# Patient Record
Sex: Male | Born: 1970 | Race: White | Hispanic: No | Marital: Married | Smoking: Never smoker
Health system: Southern US, Community
[De-identification: ages and names within clinical notes are randomized; demographics above are authoritative.]

## PROBLEM LIST (undated history)

## (undated) DIAGNOSIS — I10 Essential (primary) hypertension: Secondary | ICD-10-CM

## (undated) HISTORY — PX: COLONOSCOPY: SHX174

## (undated) HISTORY — PX: WISDOM TOOTH EXTRACTION: SHX21

---

## 2012-12-29 HISTORY — PX: COLONOSCOPY: SHX174

## 2015-01-24 ENCOUNTER — Ambulatory Visit (INDEPENDENT_AMBULATORY_CARE_PROVIDER_SITE_OTHER): Payer: BLUE CROSS/BLUE SHIELD | Admitting: Podiatry

## 2015-01-24 ENCOUNTER — Encounter: Payer: Self-pay | Admitting: Podiatry

## 2015-01-24 ENCOUNTER — Ambulatory Visit (INDEPENDENT_AMBULATORY_CARE_PROVIDER_SITE_OTHER): Payer: BLUE CROSS/BLUE SHIELD

## 2015-01-24 VITALS — BP 117/81 | HR 89 | Resp 14

## 2015-01-24 DIAGNOSIS — M216X9 Other acquired deformities of unspecified foot: Secondary | ICD-10-CM | POA: Diagnosis not present

## 2015-01-24 DIAGNOSIS — R52 Pain, unspecified: Secondary | ICD-10-CM | POA: Diagnosis not present

## 2015-01-24 DIAGNOSIS — M722 Plantar fascial fibromatosis: Secondary | ICD-10-CM | POA: Insufficient documentation

## 2015-01-24 HISTORY — DX: Plantar fascial fibromatosis: M72.2

## 2015-01-24 HISTORY — DX: Other acquired deformities of unspecified foot: M21.6X9

## 2015-01-24 MED ORDER — MELOXICAM 15 MG PO TABS: 15.0000 mg | ORAL_TABLET | Freq: Every day | ORAL | Status: DC

## 2015-01-24 NOTE — Progress Notes (Signed)
   Subjective:    Patient ID: Troy Jacobs, male    DOB: 05-07-1970, 45 y.o.   MRN: [id number]  HPI this patient presents to the office with chief complaint of painful heels on both feet. He states he has pain experiencing after days activity as well as awakening in the morning.  He says he has been dealing with this pain for over 15 years. He says he has tried many over-the-counter arch supports which help initially but then the heel pain returns. He admits he has severe high arch feet. He presents the office today for an evaluation and treatment and recommendation for his feet  The patient is here today for B/L feet pain (especially the heels).  Review of Systems  All other systems reviewed and are negative.      Objective:   Physical Exam GENERAL APPEARANCE: Alert, conversant. Appropriately groomed. No acute distress.  VASCULAR: Pedal pulses palpable at  [facility] and PT bilateral.  Capillary refill time is immediate to all digits,  Normal temperature gradient.  Digital hair growth is present bilateral  NEUROLOGIC: sensation is normal to 5.07 monofilament at 5/5 sites bilateral.  Light touch is intact bilateral, Muscle strength normal.  MUSCULOSKELETAL: acceptable muscle strength, tone and stability bilateral.  Intrinsic muscluature intact bilateral.  Rectus appearance of foot and digits noted bilateral. Palpable pain at the insertion plantar fascia B/L.  DERMATOLOGIC: skin color, texture, and turgor are within normal limits.  No preulcerative lesions or ulcers  are seen, no interdigital maceration noted.  No open lesions present.  Digital nails are asymptomatic. No drainage noted.         Assessment & Plan:  Plantar fascitis  Cavus feet B/L  IE  Xrays taken.  Discussed his condition with patient.  Told to schedule orthotic casting. Prescribed Mobic to be taken prn.   Gardiner Barefoot DPM   Gardiner Barefoot DPM

## 2015-02-21 ENCOUNTER — Other Ambulatory Visit: Payer: Self-pay | Admitting: Podiatry

## 2017-05-06 DIAGNOSIS — K429 Umbilical hernia without obstruction or gangrene: Secondary | ICD-10-CM | POA: Insufficient documentation

## 2017-05-06 HISTORY — DX: Umbilical hernia without obstruction or gangrene: K42.9

## 2017-06-20 ENCOUNTER — Other Ambulatory Visit: Payer: Self-pay | Admitting: Urology

## 2017-07-01 DIAGNOSIS — C801 Malignant (primary) neoplasm, unspecified: Secondary | ICD-10-CM | POA: Insufficient documentation

## 2017-07-01 HISTORY — DX: Malignant (primary) neoplasm, unspecified: C80.1

## 2017-07-17 ENCOUNTER — Encounter (HOSPITAL_COMMUNITY): Payer: Self-pay

## 2017-07-17 NOTE — Patient Instructions (Signed)
Troy Jacobs  07/17/2017   Your procedure is scheduled on: 07-24-17  Report to [facility] Main  Entrance             Report to admitting at      [place] AM    Call this number if you have problems the morning of surgery [phone]    Remember:   Follow a clear liquid diet the day before your surgery Do not eat food or drink liquids :After Midnight.   Drink one bottle of magnesium citrate by noon the day before your surgery    CLEAR LIQUID DIET   Foods Allowed                                                                     Foods Excluded  Coffee and tea, regular and decaf                             liquids that you cannot  Plain Jell-O in any flavor                                             see through such as: Fruit ices (not with fruit pulp)                                     milk, soups, orange juice  Iced Popsicles                                    All solid food Carbonated beverages, regular and diet                                    Cranberry, grape and apple juices Sports drinks like Gatorade Lightly seasoned clear broth or consume(fat free) Sugar, honey syrup  Sample Menu Breakfast                                Lunch                                     Supper Cranberry juice                    Beef broth                            Chicken broth Jell-O                                     Grape juice  Apple juice Coffee or tea                        Jell-O                                      Popsicle                                                Coffee or tea                        Coffee or tea  _____________________________________________________________________     Take these medicines the morning of surgery with A SIP OF WATER: NONE                                You may not have any metal on your body including hair pins and              piercings  Do not wear jewelry,lotions, powders or perfumes,  deodorant                  Men may shave face and neck.   Do not bring valuables to the hospital. [place].  Contacts, dentures or bridgework may not be worn into surgery.  Leave suitcase in the car. After surgery it may be brought to your room.               Please read over the following fact sheets you were given: _____________________________________________________________________           [facility] - Preparing for Surgery Before surgery, you can play an important role.  Because skin is not sterile, your skin needs to be as free of germs as possible.  You can reduce the number of germs on your skin by washing with CHG (chlorahexidine gluconate) soap before surgery.  CHG is an antiseptic cleaner which kills germs and bonds with the skin to continue killing germs even after washing. Please DO NOT use if you have an allergy to CHG or antibacterial soaps.  If your skin becomes reddened/irritated stop using the CHG and inform your nurse when you arrive at Short Stay. Do not shave (including legs and underarms) for at least 48 hours prior to the first CHG shower.  You may shave your face/neck. Please follow these instructions carefully:  1.  Shower with CHG Soap the night before surgery and the  morning of Surgery.  2.  If you choose to wash your hair, wash your hair first as usual with your  normal  shampoo.  3.  After you shampoo, rinse your hair and body thoroughly to remove the  shampoo.                           4.  Use CHG as you would any other liquid soap.  You can apply chg directly  to the skin and wash  Gently with a scrungie or clean washcloth.  5.  Apply the CHG Soap to your body ONLY FROM THE NECK DOWN.   Do not use on face/ open                           Wound or open sores. Avoid contact with eyes, ears mouth and genitals (private parts).                       Wash face,  Genitals (private parts) with  your normal soap.             6.  Wash thoroughly, paying special attention to the area where your surgery  will be performed.  7.  Thoroughly rinse your body with warm water from the neck down.  8.  DO NOT shower/wash with your normal soap after using and rinsing off  the CHG Soap.                9.  Pat yourself dry with a clean towel.            10.  Wear clean pajamas.            11.  Place clean sheets on your bed the night of your first shower and do not  sleep with pets. Day of Surgery : Do not apply any lotions/deodorants the morning of surgery.  Please wear clean clothes to the hospital/surgery center.  FAILURE TO FOLLOW THESE INSTRUCTIONS MAY RESULT IN THE CANCELLATION OF YOUR SURGERY PATIENT SIGNATURE_________________________________  NURSE SIGNATURE__________________________________  ________________________________________________________________________

## 2017-07-18 ENCOUNTER — Encounter (HOSPITAL_COMMUNITY): Payer: Self-pay

## 2017-07-18 ENCOUNTER — Other Ambulatory Visit: Payer: Self-pay

## 2017-07-18 ENCOUNTER — Encounter (HOSPITAL_COMMUNITY)
Admission: RE | Admit: 2017-07-18 | Discharge: 2017-07-18 | Disposition: A | Discharge status: Home / Self Care | Payer: BLUE CROSS/BLUE SHIELD | Source: Ambulatory Visit | Attending: Urology | Admitting: Urology

## 2017-07-18 DIAGNOSIS — N2889 Other specified disorders of kidney and ureter: Secondary | ICD-10-CM | POA: Insufficient documentation

## 2017-07-18 DIAGNOSIS — Z01818 Encounter for other preprocedural examination: Secondary | ICD-10-CM | POA: Insufficient documentation

## 2017-07-18 HISTORY — DX: Essential (primary) hypertension: I10

## 2017-07-18 NOTE — Progress Notes (Signed)
EKG 07-04-17  On chart  CBC and CMP from 07-04-17 on chart

## 2017-07-23 NOTE — Anesthesia Preprocedure Evaluation (Addendum)
Anesthesia Evaluation  Patient identified by MRN, date of birth, ID band Patient awake    Reviewed: Allergy & Precautions, NPO status , Patient's Chart, lab work & pertinent test results  Airway Mallampati: II  TM Distance: >3 FB Neck ROM: Full    Dental no notable dental hx.    Pulmonary neg pulmonary ROS,    Pulmonary exam normal breath sounds clear to auscultation       Cardiovascular hypertension, Pt. on medications Normal cardiovascular exam Rhythm:Regular Rate:Normal  ECG: NSR, rate 82   Neuro/Psych negative neurological ROS  negative psych ROS   GI/Hepatic negative GI ROS, Neg liver ROS,   Endo/Other  negative endocrine ROS  Renal/GU negative Renal ROS     Musculoskeletal negative musculoskeletal ROS (+)   Abdominal   Peds  Hematology negative hematology ROS (+)   Anesthesia Other Findings LEFT RENAL MASS  Reproductive/Obstetrics                            Anesthesia Physical Anesthesia Plan  ASA: II  Anesthesia Plan: General   Post-op Pain Management:    Induction: Intravenous  PONV Risk Score and Plan: 4 or greater and Ondansetron, Dexamethasone, Midazolam, Treatment may vary due to age or medical condition and Scopolamine patch - Pre-op  Airway Management Planned: Oral ETT  Additional Equipment:   Intra-op Plan:   Post-operative Plan: Extubation in OR  Informed Consent: I have reviewed the patients History and Physical, chart, labs and discussed the procedure including the risks, benefits and alternatives for the proposed anesthesia with the patient or authorized representative who has indicated his/her understanding and acceptance.   Dental advisory given  Plan Discussed with: CRNA  Anesthesia Plan Comments:         Anesthesia Quick Evaluation

## 2017-07-24 ENCOUNTER — Inpatient Hospital Stay (HOSPITAL_COMMUNITY): Payer: BLUE CROSS/BLUE SHIELD | Admitting: Anesthesiology

## 2017-07-24 ENCOUNTER — Encounter (HOSPITAL_COMMUNITY)
Admission: RE | Disposition: A | Discharge status: Home / Self Care | Payer: Self-pay | Source: Ambulatory Visit | Attending: Urology

## 2017-07-24 ENCOUNTER — Other Ambulatory Visit: Payer: Self-pay

## 2017-07-24 ENCOUNTER — Encounter (HOSPITAL_COMMUNITY): Payer: Self-pay

## 2017-07-24 ENCOUNTER — Inpatient Hospital Stay (HOSPITAL_COMMUNITY)
Admission: RE | Admit: 2017-07-24 | Discharge: 2017-07-25 | DRG: 658 | Disposition: A | Discharge status: Home / Self Care | Payer: BLUE CROSS/BLUE SHIELD | Source: Ambulatory Visit | Attending: Urology | Admitting: Urology

## 2017-07-24 DIAGNOSIS — N2889 Other specified disorders of kidney and ureter: Secondary | ICD-10-CM | POA: Diagnosis present

## 2017-07-24 DIAGNOSIS — E785 Hyperlipidemia, unspecified: Secondary | ICD-10-CM | POA: Diagnosis present

## 2017-07-24 DIAGNOSIS — I10 Essential (primary) hypertension: Secondary | ICD-10-CM | POA: Diagnosis present

## 2017-07-24 DIAGNOSIS — Z79899 Other long term (current) drug therapy: Secondary | ICD-10-CM

## 2017-07-24 DIAGNOSIS — C642 Malignant neoplasm of left kidney, except renal pelvis: Principal | ICD-10-CM | POA: Diagnosis present

## 2017-07-24 HISTORY — DX: Other specified disorders of kidney and ureter: N28.89

## 2017-07-24 HISTORY — PX: ROBOTIC ASSITED PARTIAL NEPHRECTOMY: SHX6087

## 2017-07-24 LAB — HEMOGLOBIN AND HEMATOCRIT, BLOOD
HEMATOCRIT: 44.5 % (ref 39.0–52.0)
Hemoglobin: 15.2 g/dL (ref 13.0–17.0)

## 2017-07-24 LAB — TYPE AND SCREEN
ABO/RH(D): A POS
ANTIBODY SCREEN: NEGATIVE

## 2017-07-24 LAB — ABO/RH: ABO/RH(D): A POS

## 2017-07-24 SURGERY — NEPHRECTOMY, PARTIAL, ROBOT-ASSISTED
Anesthesia: General | Laterality: Left

## 2017-07-24 MED ORDER — DEXTROSE-NACL 5-0.45 % IV SOLN
INTRAVENOUS | Status: DC
  Administered 2017-07-24 (×2): via INTRAVENOUS

## 2017-07-24 MED ORDER — ROCURONIUM BROMIDE 10 MG/ML (PF) SYRINGE
PREFILLED_SYRINGE | INTRAVENOUS | Status: AC
  Filled 2017-07-24: qty 10

## 2017-07-24 MED ORDER — OXYCODONE HCL 5 MG PO TABS: 5.0000 mg | ORAL_TABLET | ORAL | Status: DC | PRN

## 2017-07-24 MED ORDER — LACTATED RINGERS IV SOLN
INTRAVENOUS | Status: DC
  Administered 2017-07-24 (×3): via INTRAVENOUS

## 2017-07-24 MED ORDER — LIDOCAINE 2% (20 MG/ML) 5 ML SYRINGE
INTRAMUSCULAR | Status: AC
  Filled 2017-07-24: qty 5

## 2017-07-24 MED ORDER — PROPOFOL 10 MG/ML IV BOLUS
INTRAVENOUS | Status: AC
Start: 2017-07-24 — End: ?
  Filled 2017-07-24: qty 20

## 2017-07-24 MED ORDER — LIDOCAINE 2% (20 MG/ML) 5 ML SYRINGE
INTRAMUSCULAR | Status: DC | PRN
  Administered 2017-07-24: 100 mg via INTRAVENOUS

## 2017-07-24 MED ORDER — POLYVINYL ALCOHOL 1.4 % OP SOLN: 1.0000 [drp] | OPHTHALMIC | Status: DC | PRN

## 2017-07-24 MED ORDER — BENAZEPRIL HCL 20 MG PO TABS
40.0000 mg | ORAL_TABLET | Freq: Every day | ORAL | Status: DC
  Administered 2017-07-24: 40 mg via ORAL
  Filled 2017-07-24: qty 2

## 2017-07-24 MED ORDER — DEXAMETHASONE SODIUM PHOSPHATE 10 MG/ML IJ SOLN
INTRAMUSCULAR | Status: DC | PRN
  Administered 2017-07-24: 10 mg via INTRAVENOUS

## 2017-07-24 MED ORDER — SCOPOLAMINE 1 MG/3DAYS TD PT72
MEDICATED_PATCH | TRANSDERMAL | Status: AC
  Filled 2017-07-24: qty 1

## 2017-07-24 MED ORDER — OXYCODONE HCL 5 MG/5ML PO SOLN
5.0000 mg | Freq: Once | ORAL | Status: DC | PRN
  Filled 2017-07-24: qty 5

## 2017-07-24 MED ORDER — FENTANYL CITRATE (PF) 100 MCG/2ML IJ SOLN
INTRAMUSCULAR | Status: DC | PRN
  Administered 2017-07-24 (×3): 50 ug via INTRAVENOUS

## 2017-07-24 MED ORDER — HYDROMORPHONE HCL 1 MG/ML IJ SOLN
0.5000 mg | INTRAMUSCULAR | Status: DC | PRN
  Administered 2017-07-24: 1 mg via INTRAVENOUS
  Administered 2017-07-24 (×2): 0.5 mg via INTRAVENOUS
  Administered 2017-07-25: 1 mg via INTRAVENOUS
  Filled 2017-07-24 (×4): qty 1

## 2017-07-24 MED ORDER — PROMETHAZINE HCL 25 MG/ML IJ SOLN
6.2500 mg | INTRAMUSCULAR | Status: DC | PRN
  Administered 2017-07-24: 12.5 mg via INTRAVENOUS

## 2017-07-24 MED ORDER — FENTANYL CITRATE (PF) 250 MCG/5ML IJ SOLN
INTRAMUSCULAR | Status: AC
  Filled 2017-07-24: qty 5

## 2017-07-24 MED ORDER — AMLODIPINE BESY-BENAZEPRIL HCL 5-40 MG PO CAPS: 1.0000 | ORAL_CAPSULE | Freq: Every evening | ORAL | Status: DC

## 2017-07-24 MED ORDER — PROPOFOL 10 MG/ML IV BOLUS
INTRAVENOUS | Status: DC | PRN
  Administered 2017-07-24: 180 mg via INTRAVENOUS

## 2017-07-24 MED ORDER — ACETAMINOPHEN 160 MG/5ML PO SOLN: 960.0000 mg | Freq: Once | ORAL | Status: AC

## 2017-07-24 MED ORDER — MIDAZOLAM HCL 5 MG/5ML IJ SOLN
INTRAMUSCULAR | Status: DC | PRN
  Administered 2017-07-24: 2 mg via INTRAVENOUS

## 2017-07-24 MED ORDER — HYDROCODONE-ACETAMINOPHEN 5-325 MG PO TABS: 1.0000 | ORAL_TABLET | Freq: Four times a day (QID) | ORAL | 0 refills | Status: DC | PRN

## 2017-07-24 MED ORDER — MIDAZOLAM HCL 2 MG/2ML IJ SOLN
INTRAMUSCULAR | Status: AC
Start: 2017-07-24 — End: ?
  Filled 2017-07-24: qty 2

## 2017-07-24 MED ORDER — AMLODIPINE BESYLATE 5 MG PO TABS
5.0000 mg | ORAL_TABLET | Freq: Every day | ORAL | Status: DC
  Administered 2017-07-24: 5 mg via ORAL
  Filled 2017-07-24: qty 1

## 2017-07-24 MED ORDER — ACETAMINOPHEN 500 MG PO TABS
1000.0000 mg | ORAL_TABLET | Freq: Once | ORAL | Status: AC
  Administered 2017-07-24: 1000 mg via ORAL
  Filled 2017-07-24: qty 2

## 2017-07-24 MED ORDER — SCOPOLAMINE 1 MG/3DAYS TD PT72
1.0000 | MEDICATED_PATCH | Freq: Once | TRANSDERMAL | Status: DC
  Administered 2017-07-24: 1.5 mg via TRANSDERMAL

## 2017-07-24 MED ORDER — POLYETHYL GLYCOL-PROPYL GLYCOL 0.4-0.3 % OP SOLN: 1.0000 [drp] | Freq: Three times a day (TID) | OPHTHALMIC | Status: DC | PRN

## 2017-07-24 MED ORDER — CEFAZOLIN SODIUM-DEXTROSE 2-4 GM/100ML-% IV SOLN
2.0000 g | INTRAVENOUS | Status: AC
  Administered 2017-07-24: 2 g via INTRAVENOUS
  Filled 2017-07-24: qty 100

## 2017-07-24 MED ORDER — HYDROMORPHONE HCL 1 MG/ML IJ SOLN
0.2500 mg | INTRAMUSCULAR | Status: DC | PRN
  Administered 2017-07-24 (×4): 0.5 mg via INTRAVENOUS

## 2017-07-24 MED ORDER — DIPHENHYDRAMINE HCL 50 MG/ML IJ SOLN: 12.5000 mg | Freq: Four times a day (QID) | INTRAMUSCULAR | Status: DC | PRN

## 2017-07-24 MED ORDER — PROMETHAZINE HCL 25 MG/ML IJ SOLN
INTRAMUSCULAR | Status: AC
  Filled 2017-07-24: qty 1

## 2017-07-24 MED ORDER — HYDROMORPHONE HCL 1 MG/ML IJ SOLN
INTRAMUSCULAR | Status: AC
  Filled 2017-07-24: qty 1

## 2017-07-24 MED ORDER — ACETAMINOPHEN 500 MG PO TABS
1000.0000 mg | ORAL_TABLET | Freq: Four times a day (QID) | ORAL | Status: AC
  Administered 2017-07-24 – 2017-07-25 (×3): 1000 mg via ORAL
  Filled 2017-07-24 (×3): qty 2

## 2017-07-24 MED ORDER — SODIUM CHLORIDE 0.9 % IJ SOLN
INTRAMUSCULAR | Status: AC
  Filled 2017-07-24: qty 20

## 2017-07-24 MED ORDER — SUGAMMADEX SODIUM 500 MG/5ML IV SOLN
INTRAVENOUS | Status: DC | PRN
  Administered 2017-07-24: 300 mg via INTRAVENOUS

## 2017-07-24 MED ORDER — SUCCINYLCHOLINE CHLORIDE 200 MG/10ML IV SOSY
PREFILLED_SYRINGE | INTRAVENOUS | Status: AC
  Filled 2017-07-24: qty 10

## 2017-07-24 MED ORDER — OXYCODONE HCL 5 MG PO TABS: 5.0000 mg | ORAL_TABLET | Freq: Once | ORAL | Status: DC | PRN

## 2017-07-24 MED ORDER — SODIUM CHLORIDE 0.9 % IJ SOLN
INTRAMUSCULAR | Status: DC | PRN
  Administered 2017-07-24: 20 mL

## 2017-07-24 MED ORDER — ONDANSETRON HCL 4 MG/2ML IJ SOLN: 4.0000 mg | INTRAMUSCULAR | Status: DC | PRN

## 2017-07-24 MED ORDER — ROCURONIUM BROMIDE 10 MG/ML (PF) SYRINGE
PREFILLED_SYRINGE | INTRAVENOUS | Status: DC | PRN
  Administered 2017-07-24: 20 mg via INTRAVENOUS
  Administered 2017-07-24: 10 mg via INTRAVENOUS
  Administered 2017-07-24: 50 mg via INTRAVENOUS
  Administered 2017-07-24: 20 mg via INTRAVENOUS

## 2017-07-24 MED ORDER — BUPIVACAINE LIPOSOME 1.3 % IJ SUSP
20.0000 mL | Freq: Once | INTRAMUSCULAR | Status: AC
  Administered 2017-07-24: 20 mL
  Filled 2017-07-24: qty 20

## 2017-07-24 MED ORDER — DEXAMETHASONE SODIUM PHOSPHATE 10 MG/ML IJ SOLN
INTRAMUSCULAR | Status: AC
  Filled 2017-07-24: qty 1

## 2017-07-24 MED ORDER — ONDANSETRON HCL 4 MG/2ML IJ SOLN
INTRAMUSCULAR | Status: AC
  Filled 2017-07-24: qty 2

## 2017-07-24 MED ORDER — ONDANSETRON HCL 4 MG/2ML IJ SOLN
INTRAMUSCULAR | Status: DC | PRN
  Administered 2017-07-24: 4 mg via INTRAVENOUS

## 2017-07-24 MED ORDER — MAGNESIUM CITRATE PO SOLN: 1.0000 | Freq: Once | ORAL | Status: DC

## 2017-07-24 MED ORDER — DIPHENHYDRAMINE HCL 12.5 MG/5ML PO ELIX: 12.5000 mg | ORAL_SOLUTION | Freq: Four times a day (QID) | ORAL | Status: DC | PRN

## 2017-07-24 SURGICAL SUPPLY — 70 items
APPLICATOR SURGIFLO ENDO (HEMOSTASIS) ×2 IMPLANT
CHLORAPREP W/TINT 26ML (MISCELLANEOUS) ×2 IMPLANT
CLIP SUT LAPRA TY ABSORB (SUTURE) ×4 IMPLANT
CLIP VESOLOCK LG 6/CT PURPLE (CLIP) ×2 IMPLANT
CLIP VESOLOCK MED LG 6/CT (CLIP) ×4 IMPLANT
CLIP VESOLOCK XL 6/CT (CLIP) IMPLANT
COVER SURGICAL LIGHT HANDLE (MISCELLANEOUS) ×2 IMPLANT
COVER TIP SHEARS 8 DVNC (MISCELLANEOUS) ×1 IMPLANT
COVER TIP SHEARS 8MM DA VINCI (MISCELLANEOUS) ×1
DECANTER SPIKE VIAL GLASS SM (MISCELLANEOUS) ×2 IMPLANT
DERMABOND ADVANCED (GAUZE/BANDAGES/DRESSINGS) ×1
DERMABOND ADVANCED .7 DNX12 (GAUZE/BANDAGES/DRESSINGS) ×1 IMPLANT
DRAIN CHANNEL 15F RND FF 3/16 (WOUND CARE) ×2 IMPLANT
DRAPE ARM DVNC X/XI (DISPOSABLE) ×4 IMPLANT
DRAPE COLUMN DVNC XI (DISPOSABLE) ×1 IMPLANT
DRAPE DA VINCI XI ARM (DISPOSABLE) ×4
DRAPE DA VINCI XI COLUMN (DISPOSABLE) ×1
DRAPE INCISE IOBAN 66X45 STRL (DRAPES) ×2 IMPLANT
DRAPE SHEET LG 3/4 BI-LAMINATE (DRAPES) ×2 IMPLANT
DRSG TEGADERM 4X4.75 (GAUZE/BANDAGES/DRESSINGS) ×2 IMPLANT
ELECT PENCIL ROCKER SW 15FT (MISCELLANEOUS) ×2 IMPLANT
ELECT REM PT RETURN 15FT ADLT (MISCELLANEOUS) ×2 IMPLANT
EVACUATOR SILICONE 100CC (DRAIN) ×2 IMPLANT
GAUZE SPONGE 2X2 8PLY STRL LF (GAUZE/BANDAGES/DRESSINGS) ×1 IMPLANT
GLOVE BIO SURGEON STRL SZ 6.5 (GLOVE) ×2 IMPLANT
GLOVE BIOGEL M STRL SZ7.5 (GLOVE) ×4 IMPLANT
GOWN STRL REUS W/TWL LRG LVL3 (GOWN DISPOSABLE) ×4 IMPLANT
HEMOSTAT SURGICEL 4X8 (HEMOSTASIS) ×2 IMPLANT
HOLDER FOLEY CATH W/STRAP (MISCELLANEOUS) ×2 IMPLANT
IRRIG SUCT STRYKERFLOW 2 WTIP (MISCELLANEOUS) ×2
IRRIGATION SUCT STRKRFLW 2 WTP (MISCELLANEOUS) ×1 IMPLANT
IV LACTATED RINGERS 1000ML (IV SOLUTION) ×2 IMPLANT
KIT BASIN OR (CUSTOM PROCEDURE TRAY) ×2 IMPLANT
LOOP VESSEL MAXI BLUE (MISCELLANEOUS) ×2 IMPLANT
MARKER SKIN DUAL TIP RULER LAB (MISCELLANEOUS) ×2 IMPLANT
NEEDLE INSUFFLATION 14GA 120MM (NEEDLE) ×2 IMPLANT
NS IRRIG 1000ML POUR BTL (IV SOLUTION) IMPLANT
PORT ACCESS TROCAR AIRSEAL 12 (TROCAR) ×1 IMPLANT
PORT ACCESS TROCAR AIRSEAL 5M (TROCAR) ×1
POSITIONER SURGICAL ARM (MISCELLANEOUS) ×4 IMPLANT
POUCH SPECIMEN RETRIEVAL 10MM (ENDOMECHANICALS) ×2 IMPLANT
RELOAD STAPLER WHITE 60MM (STAPLE) IMPLANT
SEAL CANN UNIV 5-8 DVNC XI (MISCELLANEOUS) ×4 IMPLANT
SEAL XI 5MM-8MM UNIVERSAL (MISCELLANEOUS) ×4
SET TRI-LUMEN FLTR TB AIRSEAL (TUBING) ×2 IMPLANT
SOLUTION ELECTROLUBE (MISCELLANEOUS) ×2 IMPLANT
SPONGE GAUZE 2X2 STER 10/PKG (GAUZE/BANDAGES/DRESSINGS) ×1
SPONGE LAP 4X18 RFD (DISPOSABLE) ×2 IMPLANT
STAPLE ECHEON FLEX 60 POW ENDO (STAPLE) IMPLANT
STAPLER RELOAD WHITE 60MM (STAPLE)
SURGIFLO W/THROMBIN 8M KIT (HEMOSTASIS) ×2 IMPLANT
SUT ETHILON 3 0 PS 1 (SUTURE) ×2 IMPLANT
SUT MNCRL AB 4-0 PS2 18 (SUTURE) ×4 IMPLANT
SUT PDS AB 1 CT1 27 (SUTURE) ×4 IMPLANT
SUT V-LOC BARB 180 2/0GR6 GS22 (SUTURE) ×2
SUT VIC AB 0 CT1 27 (SUTURE) ×4
SUT VIC AB 0 CT1 27XBRD ANTBC (SUTURE) ×4 IMPLANT
SUT VIC AB 2-0 SH 27 (SUTURE) ×2
SUT VIC AB 2-0 SH 27X BRD (SUTURE) ×2 IMPLANT
SUT VICRYL 0 UR6 27IN ABS (SUTURE) ×2 IMPLANT
SUT VLOC BARB 180 ABS3/0GR12 (SUTURE) ×2
SUTURE V-LC BRB 180 2/0GR6GS22 (SUTURE) ×1 IMPLANT
SUTURE VLOC BRB 180 ABS3/0GR12 (SUTURE) ×1 IMPLANT
TOWEL OR 17X26 10 PK STRL BLUE (TOWEL DISPOSABLE) ×2 IMPLANT
TOWEL OR NON WOVEN STRL DISP B (DISPOSABLE) ×2 IMPLANT
TRAY FOLEY MTR SLVR 16FR STAT (SET/KITS/TRAYS/PACK) ×2 IMPLANT
TRAY LAPAROSCOPIC (CUSTOM PROCEDURE TRAY) ×2 IMPLANT
TROCAR BLADELESS OPT 5 100 (ENDOMECHANICALS) IMPLANT
TROCAR XCEL 12X100 BLDLESS (ENDOMECHANICALS) ×2 IMPLANT
WATER STERILE IRR 1000ML POUR (IV SOLUTION) ×2 IMPLANT

## 2017-07-24 NOTE — H&P (Signed)
Troy Jacobs is an 47 y.o. male.    Chief Complaint: Pre-op LEFT Partial Nephrectomy  HPI:   1 - Left Renal Mass - 1.5cm approx 50% exophytic cystic-solid enhancing renal mass incidental by CT and then dedicated MRI 05/2017. Mass is solitary, lower pole medial. 1 artery / 1 vein left renovascular anatomy.   PMH sig for HTN, HLD. NO ischemic CV disease / blood thinners. NO prior surgeries. His PCP is Troy Jump PA in [place]. He works for [employer].   Today "Troy Jacobs" is seen to proceed with LEFT robotic partial nephrectomy.     Past Medical History:  Diagnosis Date  . Hypertension     Past Surgical History:  Procedure Laterality Date  . COLONOSCOPY    . WISDOM TOOTH EXTRACTION      History reviewed. No pertinent family history. Social History:  reports that he has never smoked. He has never used smokeless tobacco. He reports that he drinks alcohol. He reports that he does not use drugs.  Allergies: No Known Allergies  Medications Prior to Admission  Medication Sig Dispense Refill  . amLODipine-benazepril (LOTREL) 5-40 MG capsule Take 1 capsule by mouth every evening.   3  . Polyethyl Glycol-Propyl Glycol (LUBRICANT EYE DROPS) 0.4-0.3 % SOLN Place 1 drop into both eyes 3 (three) times daily as needed (for dry eyes.).      Results for orders placed or performed during the hospital encounter of 07/24/17 (from the past 48 hour(s))  Type and screen All Cardiac and thoracic surgeries, spinal fusions, myomectomies, craniotomies, colon & liver resections, total joint revisions, same day c-section with placenta previa or accreta.     Status: None (Preliminary result)   Collection Time: 07/24/17  7:20 AM  Result Value Ref Range   ABO/RH(D) A POS    Antibody Screen PENDING    Sample Expiration      07/27/2017 Performed at [facility], [place] [address] [place], [place] [address]    No results found.  Review of Systems  Constitutional: Negative.   HENT:  Negative.   Eyes: Negative.   Respiratory: Negative.   Cardiovascular: Negative.   Gastrointestinal: Negative.   Genitourinary: Negative.   Musculoskeletal: Negative.   Skin: Negative.   Neurological: Negative.   Endo/Heme/Allergies: Negative.   Psychiatric/Behavioral: Negative.     Blood pressure (!) 153/94, pulse 86, temperature 98 F (36.7 C), temperature source Oral, resp. rate 16, height 5\' 8"  (1.727 m), weight 81.2 kg (179 lb), SpO2 99 %. Physical Exam  Constitutional: He appears well-developed.  HENT:  Head: Normocephalic.  Eyes: Pupils are equal, round, and reactive to light.  Neck: Normal range of motion.  Cardiovascular: Normal rate.  Respiratory: Effort normal.  GI: Soft.  Genitourinary:  Genitourinary Comments: No CVAT  Musculoskeletal: Normal range of motion.  Neurological: He is alert.  Skin: Skin is warm.  Psychiatric: He has a normal mood and affect.     Assessment/Plan  Proceed as planned with LEFT robotic partial nephrectomy. Risks, benefits, alternatives, expected peri-op course discussed previously and reiterated today.   Troy Frock, MD 07/24/2017, 8:16 AM

## 2017-07-24 NOTE — Transfer of Care (Signed)
Immediate Anesthesia Transfer of Care Note  Patient: Troy Jacobs  Procedure(s) Performed: XI ROBOTIC ASSITED PARTIAL NEPHRECTOMY (Left )  Patient Location: PACU  Anesthesia Type:General  Level of Consciousness: sedated  Airway & Oxygen Therapy: Patient Spontanous Breathing and Patient connected to face mask oxygen  Post-op Assessment: Report given to RN and Post -op Vital signs reviewed and stable  Post vital signs: Reviewed and stable  Last Vitals:  Vitals Value Taken Time  BP 122/87 07/24/2017 11:11 AM  Temp    Pulse 85 07/24/2017 11:12 AM  Resp 12 07/24/2017 11:12 AM  SpO2 100 % 07/24/2017 11:12 AM  Vitals shown include unvalidated device data.  Last Pain:  Vitals:   07/24/17 0711  TempSrc:   PainSc: 0-No pain      Patients Stated Pain Goal: 4 (27/78/24 2353)  Complications: No apparent anesthesia complications

## 2017-07-24 NOTE — Anesthesia Postprocedure Evaluation (Signed)
Anesthesia Post Note  Patient: Troy Jacobs  Procedure(s) Performed: XI ROBOTIC ASSITED PARTIAL NEPHRECTOMY (Left )     Patient location during evaluation: PACU Anesthesia Type: General Level of consciousness: awake and alert Pain management: pain level controlled Vital Signs Assessment: post-procedure vital signs reviewed and stable Respiratory status: spontaneous breathing, nonlabored ventilation, respiratory function stable and patient connected to nasal cannula oxygen Cardiovascular status: blood pressure returned to baseline and stable Postop Assessment: no apparent nausea or vomiting Anesthetic complications: no    Last Vitals:  Vitals:   07/24/17 1424 07/24/17 1447  BP: 129/88 (!) 139/93  Pulse: 89 93  Resp: 18 20  Temp: (!) 36.2 C 37.6 C  SpO2: 100% 100%    Last Pain:  Vitals:   07/24/17 1539  TempSrc:   PainSc: Asleep                 Ryan P Ellender

## 2017-07-24 NOTE — Anesthesia Procedure Notes (Signed)
Procedure Name: Intubation Date/Time: 07/24/2017 8:34 AM Performed by: Lind Covert, CRNA Pre-anesthesia Checklist: Patient identified, Emergency Drugs available, Suction available, Patient being monitored and Timeout performed Patient Re-evaluated:Patient Re-evaluated prior to induction Oxygen Delivery Method: Circle system utilized Preoxygenation: Pre-oxygenation with 100% oxygen Induction Type: IV induction Ventilation: Mask ventilation without difficulty Laryngoscope Size: Mac and 4 Grade View: Grade I Tube type: Oral Tube size: 7.5 mm Number of attempts: 1 Airway Equipment and Method: Stylet Placement Confirmation: ETT inserted through vocal cords under direct vision,  positive ETCO2 and breath sounds checked- equal and bilateral Secured at: 22 cm Tube secured with: Tape Dental Injury: Teeth and Oropharynx as per pre-operative assessment

## 2017-07-24 NOTE — Op Note (Signed)
NAMEJAZ, MALLICK MEDICAL RECORD [id number] ACCOUNT [id number] DATE OF BIRTH:11-Apr-1970 FACILITY: [facility] LOCATION: [facility] PHYSICIAN:Welma Mccombs, MD  OPERATIVE REPORT  DATE OF PROCEDURE:  07/24/2017  PREOPERATIVE DIAGNOSIS:  Small left renal mass.  PROCEDURE:  Robotic-assisted laparoscopic left partial nephrectomy.  ESTIMATED BLOOD LOSS:  Nil.  COMPLICATIONS:  None.  SPECIMENS: 1.  Fat around the mass. 2.  Left partial nephrectomy. 3.  A deep margin.  SURGEON:  Alexis Frock, MD  ASSISTANT:  Debbrah Alar, PA  COMPLICATIONS:  None.    DRAINS:   1.  One Jackson-Pratt drain to bulb suction. 2.  Foley catheter.  FINDINGS: 1.  Single artery, single vein, left renal vascular anatomy with left vein as anticipated. 2.  Warm ischemia time of 16 minutes. 3.  Mostly exophytic left lower medial renal mass.  INDICATIONS:  The patient is a very pleasant 47 year old gentleman who was found incidentally to have a left lower pole solid enhancing renal mass concerning for a stage I localized kidney cancer.  Options were discussed for management including  surveillance protocols versus ablative therapy versus surgical extirpation and he wished to proceed with left partial nephrectomy.  Informed consent was then placed in medical record.  DESCRIPTION OF PROCEDURE:  The patient identified as being Troy Jacobs, procedure being left partial nephrectomy was confirmed.  Procedure timeout was performed.  Intravenous antibiotics administered.  General endotracheal anesthesia induced.  The  patient was placed into the left side up full flank position, pulling 15 degrees of table flexion and superior and  lateral elevator axillary rolls,  sequential pressure devices, bottom leg bent, top leg straight.  He was further fastened to the  operative table using 3-inch tape over foam padding across the supraxiphoid chest and his pelvis.  Beanbag was deployed.  Foley catheter was placed through  the straight drain.  Sterile field was created by first clipping and shaving then prepping and  draping the patient's entire left flank and abdomen using chlorhexidine gluconate.  Next, a high-flow, low-pressure pneumoperitoneum was obtained using Veress technique in the left lower quadrant, having passed the aspiration and drop test.  An 8 mm  robotic camera port was then placed in position approximately 4 fingerbreadths superolateral to the umbilicus.  Laparoscopic examination of the peritoneal cavity revealed no significant adhesions and no visceral injury.  Distal ports were placed as  follows:  Left subcostal 8 mm robotic port, left far lateral 8 mm robotic port approximately 3 fingerbreadths superior and medial to the anterior iliac spine, left inferior paramedian robotic port approximately 1 handbreadth superior to the pubic ramus  and 2 assistant port sites in the midline, one in the supraumbilical crease and another 2 fingerbreadths above the camera port.  Robot was docked and passed the electronic checks.  Initial attention was directed to development of the retroperitoneum.   Incision was made lateral to the ascending colon from the area of the splenic flexure towards the area of the internal ring and the colon was carefully swept medially, exposing the anterior surface of Gerota's fascia.  Lateral splenic attachments were  taken down allowing the spleen to roll medially away from the superior anterior surface of Gerota's fascia.  The descending colonic mesentery was carefully dissected away from the anterior surface of Gerota's fascia.  The lower pole of the kidney was  identified, placed on gentle lateral traction.  Dissection proceeded medial to this, the ureter and gonadal vessels were encountered and placed on gentle lateral traction.  Dissection proceeded within the  triangle of these structures and the psoas  musculature towards the area of the renal hilum.  Renal hilum consisted of a single  artery, single vein, left renal vascular anatomy.  There was a dominant lumbar vein noted that did somewhat intimate the window to the artery.  The lumbar vein was  controlled using a small endoscopic clip proximal distal.  This allowed much wider exposure of the left renal artery stump, which again was singular.  This was marked with a vessel loop.  Attention was directed at mass identification.  Dissection  proceeded directly onto the lower pole parenchyma of the kidney and this was carefully traced medially towards the area of the mass.  As expected, there was predominantly exophytic small lower pole mass in the medial orientation.  Very careful dissection  was performed to sweep the ureter medial to this plane of dissection.  The mass was in direct apposition to the ureter otherwise and very near the lower aspect of the renal sinus.  The area of mass was carefully scored, approximately 5 mm lateral in all  directions for plane of presumed partial nephrectomy and the anatomy appeared amenable to this.  Warm ischemia was then achieved using 2 bulldog clamps on the artery, and partial nephrectomy was performed using cold scissors.  There appeared to be a rim  of normal parenchyma with the area of partial nephrectomy.  This was set aside and placed in an EndoCatch bag for later retrieval.  A separate deep margin was set aside for permanent pathology.  The fat overlying the mass was also set aside separately  for permanent pathology.  First layer renorrhaphy was performed using a single layer of 3-0 V-Loc suture oversewing several small venous sinuses.  There was no obvious injury to the collecting system.  A bolster was then applied and Surgicel into  parenchymal opposition sutures of  0 Vicryl sandwiched between Hem-o-lok's and Lapra-Ty's were carefully applied.  This resulted in excellent parenchymal position around the bolster.  Warm ischemia was then stopped.  Hemostasis appeared excellent.  Sponge,  needle counts were correct.   Retroperitoneum was reapproximated using running 2-0 V-Loc bringing the ascending colon back over the anterior surface of Gerota's fascia.  Closed suction drain was brought out the previous lateral most robotic port site into the peritoneal cavity.   Robot was undocked.  Specimen was retrieved by extending the previous inferior port site slightly, removing the partial nephrectomy specimen setting aside for pathology.  The extraction site was only approximately 13 mm or so in length was closed with  fascia using interrupted Vicryl as was the superior An assistant port site.  All incision sites were infiltrated with dilute lipolyzed Marcaine and closed with skin using subcuticular Monocryl and Dermabond.  The procedure terminated.    The patient tolerated the procedure well.  No immediate apparent complications.  The patient was taken to [place] [facility] in stable condition.  Please note, first assistant Debbrah Alar was crucial for all portions of the procedure today.  He provided vascular clipping, vascular clamping, specimen manipulation,  suture passage and retraction.  AN/NUANCE  D:07/24/2017 T:07/24/2017 JOB:[id number]

## 2017-07-24 NOTE — Brief Op Note (Signed)
07/24/2017  10:58 AM  PATIENT:  Janann Colonel  47 y.o. male  PRE-OPERATIVE DIAGNOSIS:  LEFT RENAL MASS  POST-OPERATIVE DIAGNOSIS:  LEFT RENAL MASS   PROCEDURE:  Procedure(s): XI ROBOTIC ASSITED PARTIAL NEPHRECTOMY (Left)  SURGEON:  Surgeon(s) and Role:    Alexis Frock, MD - Primary  PHYSICIAN ASSISTANT:   ASSISTANTS: Debbrah Alar PA   ANESTHESIA:   local and general  EBL:  10 mL   BLOOD ADMINISTERED:none  DRAINS: 1 - JP to bulb, 2 - Foley to gravity   LOCAL MEDICATIONS USED:  MARCAINE     SPECIMEN:  Source of Specimen:  1 - fat around mass, 2 - left partial nephrectomy, 3 - deep margin  DISPOSITION OF SPECIMEN:  PATHOLOGY  COUNTS:  YES  TOURNIQUET:  * No tourniquets in log *  DICTATION: .Other Dictation: Dictation Number [id number]  PLAN OF CARE: Admit to inpatient   PATIENT DISPOSITION:  PACU - hemodynamically stable.   Delay start of Pharmacological VTE agent (>24hrs) due to surgical blood loss or risk of bleeding: yes

## 2017-07-24 NOTE — Discharge Instructions (Signed)

## 2017-07-25 ENCOUNTER — Encounter (HOSPITAL_COMMUNITY): Payer: Self-pay | Admitting: Urology

## 2017-07-25 DIAGNOSIS — Z79899 Other long term (current) drug therapy: Secondary | ICD-10-CM | POA: Diagnosis not present

## 2017-07-25 DIAGNOSIS — E785 Hyperlipidemia, unspecified: Secondary | ICD-10-CM | POA: Diagnosis present

## 2017-07-25 DIAGNOSIS — I10 Essential (primary) hypertension: Secondary | ICD-10-CM | POA: Diagnosis present

## 2017-07-25 DIAGNOSIS — N2889 Other specified disorders of kidney and ureter: Secondary | ICD-10-CM | POA: Diagnosis present

## 2017-07-25 DIAGNOSIS — C642 Malignant neoplasm of left kidney, except renal pelvis: Secondary | ICD-10-CM | POA: Diagnosis present

## 2017-07-25 LAB — BASIC METABOLIC PANEL
Anion gap: 8 (ref 5–15)
BUN: 10 mg/dL (ref 6–20)
CALCIUM: 8.8 mg/dL — AB (ref 8.9–10.3)
CO2: 28 mmol/L (ref 22–32)
CREATININE: 0.91 mg/dL (ref 0.61–1.24)
Chloride: 105 mmol/L (ref 98–111)
GFR calc Af Amer: >60 mL/min (ref >60)
Glucose, Bld: 150 mg/dL — ABNORMAL HIGH (ref 70–99)
Potassium: 4 mmol/L (ref 3.5–5.1)
SODIUM: 141 mmol/L (ref 135–145)

## 2017-07-25 LAB — HEMOGLOBIN AND HEMATOCRIT, BLOOD
HEMATOCRIT: 43 % (ref 39.0–52.0)
HEMOGLOBIN: 14.6 g/dL (ref 13.0–17.0)

## 2017-07-25 MED ORDER — SENNOSIDES-DOCUSATE SODIUM 8.6-50 MG PO TABS: 1.0000 | ORAL_TABLET | Freq: Two times a day (BID) | ORAL | 0 refills | Status: DC

## 2017-07-25 NOTE — Progress Notes (Signed)
Pt discharge to home with spouse, instructions reviewed with pt and spouse. acknowledged understanding. Incision dry and intact. JP site dry. Pt voiding and BM noted. Pt tolerated PO without nausea. SRP, RN

## 2017-07-25 NOTE — Progress Notes (Signed)
Urology Progress Note   1 Day Post-Op  Subjective: NAEON.   Objective: Vital signs in last 24 hours: Temp:  [97.2 F (36.2 C)-99.6 F (37.6 C)] 97.6 F (36.4 C) (07/25 0411) Pulse Rate:  [70-95] 89 (07/25 0411) Resp:  [11-23] 14 (07/25 0411) BP: (106-139)/(68-93) 107/68 (07/25 0411) SpO2:  [97 %-100 %] 98 % (07/25 0411)  Intake/Output from previous day: 07/24 0701 - 07/25 0700 In: 4003.3 [P.O.:420; I.V.:3583.3] Out: 2185 [Urine:2175; Blood:10] Intake/Output this shift: No intake/output data recorded.  Physical Exam:  General: Alert and oriented CV: RRR Lungs: Clear Abdomen: Soft, appropriately tender, incisions c/d/i, JP drain with scan s/s fluid in bulb GU: Foley in place draining clear yellow urine Ext: NT, No erythema  Lab Results: Recent Labs    07/24/17 1144 07/25/17 0439  HGB 15.2 14.6  HCT 44.5 43.0   BMET Recent Labs    07/25/17 0439  NA 141  K 4.0  CL 105  CO2 28  GLUCOSE 150*  BUN 10  CREATININE 0.91  CALCIUM 8.8*     Studies/Results: No results found.  Assessment/Plan:  47 y.o. male s/p L partial nephrectomy. Overall doing well post-op.  1) SL IVF 2) Ambulate, Incentive spirometry 3) Transition to oral pain medication 4) D/C JP drain 5) Plan for likely discharge later today  Dispo: Floor   LOS: 1 day   Case Rob Bunting 07/25/2017, 7:22 AM

## 2017-07-25 NOTE — Discharge Summary (Signed)
Physician Discharge Summary  Patient ID: Troy Jacobs MRN: [id number] DOB/AGE: [birth year] 47 y.o.  Admit date: 07/24/2017 Discharge date: 07/25/2017  Admission Diagnoses: LEFT Renal Mass  Discharge Diagnoses:  Active Problems:   Renal mass   Discharged Condition: good  Hospital Course: Pt underwent LEFT robotic partial nephrectomy on 07/24/17 without acute complications. He was admitted to 4th floor Urology service post-op where he began his recovery. His catheter was removed AM of POD 1 and JP output remained scant therefore removed. By the afternoon of POD 1, he is ambulatory, pain controlled on PO meds, maintaining PO nutrition, and felt to be adequate for discharge. Hgb 14.6, path pending at discharge.   Consults: None  Significant Diagnostic Studies: labs: as per abvoe  Treatments: surgery: as per above  Discharge Exam: Blood pressure 118/70, pulse 62, temperature 97.9 F (36.6 C), temperature source Oral, resp. rate 18, height 5\' 8"  (1.727 m), weight 81.2 kg (179 lb), SpO2 100 %. General appearance: alert, cooperative, appears stated age and wife at bedside Eyes: negative Nose: Nares normal. Septum midline. Mucosa normal. No drainage or sinus tenderness. Throat: lips, mucosa, and tongue normal; teeth and gums normal Neck: supple, symmetrical, trachea midline Back: symmetric, no curvature. ROM normal. No CVA tenderness. Resp: non-labored on room air.  Cardio: Nl rate GI: soft, non-tender; bowel sounds normal; no masses,  no organomegaly Male genitalia: normal Extremities: extremities normal, atraumatic, no cyanosis or edema Skin: Skin color, texture, turgor normal. No rashes or lesions Lymph nodes: Cervical, supraclavicular, and axillary nodes normal. Neurologic: Grossly normal Incision/Wound: recent surgical sites c/d/i. Prior left JP drain site with dry dressing in place w/o discharge.   Disposition: Discharge disposition: 01-Home or Self Care        Allergies  as of 07/25/2017   No Known Allergies     Medication List    TAKE these medications   amLODipine-benazepril 5-40 MG capsule Commonly known as:  LOTREL Take 1 capsule by mouth every evening.   HYDROcodone-acetaminophen 5-325 MG tablet Commonly known as:  NORCO Take 1-2 tablets by mouth every 6 (six) hours as needed for moderate pain or severe pain.   LUBRICANT EYE DROPS 0.4-0.3 % Soln Generic drug:  Polyethyl Glycol-Propyl Glycol Place 1 drop into both eyes 3 (three) times daily as needed (for dry eyes.).   senna-docusate 8.6-50 MG tablet Commonly known as:  Senokot-S Take 1 tablet by mouth 2 (two) times daily. While taking strong pain meds to prevent constipation      Follow-up Information    Alexis Frock, MD Follow up on 08/12/2017.   Specialty:  Urology Why:  at 8:30 Contact information: [place] [id number] [phone]           Signed: Alexis Frock 07/25/2017, 3:07 PM

## 2017-07-25 NOTE — Progress Notes (Signed)
Pt ambulated in halls x2 180 feet then 360 feet. Pt denies nausea. Tol POs. Pt sitting in chair, spouse at bedside. Pain 4/10. Pt less drowsy, more alert and able to ambulate independent with spouse. Will cont to monitor. SRP,RN

## 2017-07-25 NOTE — Progress Notes (Signed)
Pt JP removed as ordered, instructed on IS and deep breathing. Pain level 3-4/10. Due to void, denies nausea.  Enc. Ambulation. Will cont to monitor. Spouse at bedside. SRP, RN

## 2017-07-25 NOTE — Plan of Care (Signed)
  Problem: Education: Goal: Knowledge of General Education information will improve Description Including pain rating scale, medication(s)/side effects and non-pharmacologic comfort measures Outcome: Progressing   

## 2017-12-19 ENCOUNTER — Encounter: Payer: Self-pay | Admitting: Gastroenterology

## 2018-03-11 ENCOUNTER — Other Ambulatory Visit (HOSPITAL_COMMUNITY): Payer: Self-pay | Admitting: Urology

## 2018-03-11 ENCOUNTER — Ambulatory Visit (HOSPITAL_COMMUNITY)
Admission: RE | Admit: 2018-03-11 | Discharge: 2018-03-11 | Disposition: A | Discharge status: Home / Self Care | Payer: BLUE CROSS/BLUE SHIELD | Source: Ambulatory Visit | Attending: Urology | Admitting: Urology

## 2018-03-11 DIAGNOSIS — C642 Malignant neoplasm of left kidney, except renal pelvis: Secondary | ICD-10-CM

## 2019-03-11 ENCOUNTER — Other Ambulatory Visit: Payer: Self-pay

## 2019-03-11 ENCOUNTER — Other Ambulatory Visit (HOSPITAL_COMMUNITY): Payer: Self-pay | Admitting: Urology

## 2019-03-11 ENCOUNTER — Ambulatory Visit (HOSPITAL_COMMUNITY)
Admission: RE | Admit: 2019-03-11 | Discharge: 2019-03-11 | Disposition: A | Discharge status: Home / Self Care | Payer: BC Managed Care – PPO | Source: Ambulatory Visit | Attending: Urology | Admitting: Urology

## 2019-03-11 DIAGNOSIS — C642 Malignant neoplasm of left kidney, except renal pelvis: Secondary | ICD-10-CM | POA: Diagnosis not present

## 2019-09-02 ENCOUNTER — Ambulatory Visit: Payer: BC Managed Care – PPO | Admitting: Family Medicine

## 2019-09-02 ENCOUNTER — Encounter: Payer: Self-pay | Admitting: Family Medicine

## 2019-09-02 ENCOUNTER — Other Ambulatory Visit: Payer: Self-pay

## 2019-09-02 VITALS — BP 128/78 | HR 72 | Temp 97.7°F | Resp 16 | Ht 68.0 in | Wt 186.4 lb

## 2019-09-02 DIAGNOSIS — E663 Overweight: Secondary | ICD-10-CM | POA: Diagnosis not present

## 2019-09-02 DIAGNOSIS — Z6828 Body mass index (BMI) 28.0-28.9, adult: Secondary | ICD-10-CM | POA: Diagnosis not present

## 2019-09-02 DIAGNOSIS — I10 Essential (primary) hypertension: Secondary | ICD-10-CM | POA: Diagnosis not present

## 2019-09-02 MED ORDER — AMLODIPINE BESY-BENAZEPRIL HCL 10-40 MG PO CAPS: 1.0000 | ORAL_CAPSULE | Freq: Every day | ORAL | 3 refills | Status: DC

## 2019-09-02 NOTE — Patient Instructions (Signed)
Continue current medications. Recommend continue to work on eating healthy diet and exercise.

## 2019-09-02 NOTE — Progress Notes (Signed)
Subjective:  Patient ID: Troy Jacobs, male    DOB: [birth year]  Age: 49 y.o. MRN: [id number]  Chief Complaint  Patient presents with  . Hypertension    HPI Pt presents for follow up of hypertension. The patient is tolerating the medication well without side effects. Taking amlodipine-benzapril. Compliance with treatment has been good; including taking medication as directed , maintains a healthy diet and regular exercise regimen , and following up as directed.   History of renal cancer cured with partial nephrectomy in 2019.  No current outpatient medications on file prior to visit.   No current facility-administered medications on file prior to visit.   Past Medical History:  Diagnosis Date  . Cancer ([place]) 07/2017   Rt partial nephrectomy  . Hypertension    Past Surgical History:  Procedure Laterality Date  . COLONOSCOPY  12/29/2012   normal  . ROBOTIC ASSITED PARTIAL NEPHRECTOMY Left 07/24/2017   Procedure: XI ROBOTIC ASSITED PARTIAL NEPHRECTOMY;  Surgeon: Alexis Frock, MD;  Location: [facility];  Service: Urology;  Laterality: Left;  . WISDOM TOOTH EXTRACTION      Family History  Problem Relation Age of Onset  . Hypertension Father    Social History   Socioeconomic History  . Marital status: Married    Spouse name: Not on file  . Number of children: Not on file  . Years of education: Not on file  . Highest education level: Not on file  Occupational History  . Occupation: Truck Education administrator: [employer]    Comment: [employer]  Tobacco Use  . Smoking status: Never Smoker  . Smokeless tobacco: Never Used  Vaping Use  . Vaping Use: Never used  Substance and Sexual Activity  . Alcohol use: Yes    Alcohol/week: 0.0 standard drinks    Comment: occas.   . Drug use: No  . Sexual activity: Yes  Other Topics Concern  . Not on file  Social History Narrative  . Not on file   Social Determinants of Health   Financial Resource Strain:   . Difficulty of Paying Living  Expenses: Not on file  Food Insecurity:   . Worried About Charity fundraiser in the Last Year: Not on file  . Ran Out of Food in the Last Year: Not on file  Transportation Needs:   . Lack of Transportation (Medical): Not on file  . Lack of Transportation (Non-Medical): Not on file  Physical Activity:   . Days of Exercise per Week: Not on file  . Minutes of Exercise per Session: Not on file  Stress:   . Feeling of Stress : Not on file  Social Connections:   . Frequency of Communication with Friends and Family: Not on file  . Frequency of Social Gatherings with Friends and Family: Not on file  . Attends Religious Services: Not on file  . Active Member of Clubs or Organizations: Not on file  . Attends Archivist Meetings: Not on file  . Marital Status: Not on file    Review of Systems  Constitutional: Negative for chills, fatigue and fever.  HENT: Negative for congestion, ear pain and sore throat.   Respiratory: Negative for cough and shortness of breath.   Cardiovascular: Negative for chest pain and palpitations.  Gastrointestinal: Negative for abdominal pain, constipation, diarrhea, nausea and vomiting.  Genitourinary: Negative for dysuria, frequency and urgency.  Musculoskeletal: Negative for arthralgias, back pain and myalgias.  Neurological: Negative for light-headedness and headaches.  Objective:  BP 128/78   Pulse 72   Temp 97.7 F (36.5 C)   Resp 16   Ht 5\' 8"  (1.727 m)   Wt 186 lb 6.4 oz (84.6 kg)   BMI 28.34 kg/m   BP/Weight 09/02/2019 07/25/2017 1/68/3729  Systolic BP 021 115 -  Diastolic BP 78 70 -  Wt. (Lbs) 186.4 - 179  BMI 28.34 - 27.22    Physical Exam Vitals reviewed.  Constitutional:      Appearance: Normal appearance.  Neck:     Vascular: No carotid bruit.  Cardiovascular:     Rate and Rhythm: Normal rate and regular rhythm.     Pulses: Normal pulses.     Heart sounds: Normal heart sounds.  Pulmonary:     Effort: Pulmonary  effort is normal.     Breath sounds: Normal breath sounds. No wheezing, rhonchi or rales.  Abdominal:     General: Bowel sounds are normal.     Palpations: Abdomen is soft.     Tenderness: There is no abdominal tenderness.  Neurological:     Mental Status: He is alert.  Psychiatric:        Mood and Affect: Mood normal.        Behavior: Behavior normal.     Diabetic Foot Exam - Simple   No data filed       Lab Results  Component Value Date   WBC 4.4 09/02/2019   HGB 16.2 09/02/2019   HCT 45.1 09/02/2019   PLT 289 09/02/2019   GLUCOSE 100 (H) 09/02/2019   CHOL 189 09/02/2019   TRIG 102 09/02/2019   HDL 48 09/02/2019   LDLCALC 123 (H) 09/02/2019   ALT 59 (H) 09/02/2019   AST 27 09/02/2019   NA 140 09/02/2019   K 4.0 09/02/2019   CL 102 09/02/2019   CREATININE 0.91 09/02/2019   BUN 10 09/02/2019   CO2 25 09/02/2019   TSH 2.280 09/02/2019      Assessment & Plan:  1. Essential hypertension, benign Well controlled.  No changes to medicines.  Continue to work on eating a healthy diet and exercise.  Labs drawn today.  - CBC with Differential/Platelet - Comprehensive metabolic panel - Lipid panel - TSH  2. Overweight with body mass index (BMI) of 28 to 28.9 in adult Recommend continue to work on eating healthy diet and exercise.    Follow-up: Return in about 1 year (around 09/01/2020) for fasting labs.  An After Visit Summary was printed and given to the patient.  [name] [facility] [phone]

## 2019-09-03 LAB — CBC WITH DIFFERENTIAL/PLATELET
Basophils Absolute: 0 10*3/uL (ref 0.0–0.2)
Basos: 1 %
EOS (ABSOLUTE): 0.2 10*3/uL (ref 0.0–0.4)
Eos: 4 %
Hematocrit: 45.1 % (ref 37.5–51.0)
Hemoglobin: 16.2 g/dL (ref 13.0–17.7)
Immature Grans (Abs): 0 10*3/uL (ref 0.0–0.1)
Immature Granulocytes: 0 %
Lymphocytes Absolute: 1.3 10*3/uL (ref 0.7–3.1)
Lymphs: 29 %
MCH: 31 pg (ref 26.6–33.0)
MCHC: 35.9 g/dL — ABNORMAL HIGH (ref 31.5–35.7)
MCV: 86 fL (ref 79–97)
Monocytes Absolute: 0.4 10*3/uL (ref 0.1–0.9)
Monocytes: 8 %
Neutrophils Absolute: 2.6 10*3/uL (ref 1.4–7.0)
Neutrophils: 58 %
Platelets: 289 10*3/uL (ref 150–450)
RBC: 5.22 x10E6/uL (ref 4.14–5.80)
RDW: 13 % (ref 11.6–15.4)
WBC: 4.4 10*3/uL (ref 3.4–10.8)

## 2019-09-03 LAB — COMPREHENSIVE METABOLIC PANEL
ALT: 59 IU/L — ABNORMAL HIGH (ref 0–44)
AST: 27 IU/L (ref 0–40)
Albumin/Globulin Ratio: 2 (ref 1.2–2.2)
Albumin: 4.7 g/dL (ref 4.0–5.0)
Alkaline Phosphatase: 84 IU/L (ref 48–121)
BUN/Creatinine Ratio: 11 (ref 9–20)
BUN: 10 mg/dL (ref 6–24)
Bilirubin Total: 0.8 mg/dL (ref 0.0–1.2)
CO2: 25 mmol/L (ref 20–29)
Calcium: 9.5 mg/dL (ref 8.7–10.2)
Chloride: 102 mmol/L (ref 96–106)
Creatinine, Ser: 0.91 mg/dL (ref 0.76–1.27)
GFR calc Af Amer: 114 mL/min/{1.73_m2} (ref >59)
GFR calc non Af Amer: 99 mL/min/{1.73_m2} (ref >59)
Globulin, Total: 2.4 g/dL (ref 1.5–4.5)
Glucose: 100 mg/dL — ABNORMAL HIGH (ref 65–99)
Potassium: 4 mmol/L (ref 3.5–5.2)
Sodium: 140 mmol/L (ref 134–144)
Total Protein: 7.1 g/dL (ref 6.0–8.5)

## 2019-09-03 LAB — LIPID PANEL
Chol/HDL Ratio: 3.9 ratio (ref 0.0–5.0)
Cholesterol, Total: 189 mg/dL (ref 100–199)
HDL: 48 mg/dL (ref >39)
LDL Chol Calc (NIH): 123 mg/dL — ABNORMAL HIGH (ref 0–99)
Triglycerides: 102 mg/dL (ref 0–149)
VLDL Cholesterol Cal: 18 mg/dL (ref 5–40)

## 2019-09-03 LAB — CARDIOVASCULAR RISK ASSESSMENT

## 2019-09-03 LAB — TSH: TSH: 2.28 u[IU]/mL (ref 0.450–4.500)

## 2019-09-09 ENCOUNTER — Encounter: Payer: Self-pay | Admitting: Family Medicine

## 2020-03-18 ENCOUNTER — Other Ambulatory Visit: Payer: Self-pay

## 2020-03-18 ENCOUNTER — Ambulatory Visit (HOSPITAL_COMMUNITY)
Admission: RE | Admit: 2020-03-18 | Discharge: 2020-03-18 | Disposition: A | Discharge status: Home / Self Care | Payer: BC Managed Care – PPO | Source: Ambulatory Visit | Attending: Urology | Admitting: Urology

## 2020-03-18 ENCOUNTER — Other Ambulatory Visit (HOSPITAL_COMMUNITY): Payer: Self-pay | Admitting: Urology

## 2020-03-18 DIAGNOSIS — C642 Malignant neoplasm of left kidney, except renal pelvis: Secondary | ICD-10-CM | POA: Diagnosis present

## 2020-08-27 ENCOUNTER — Other Ambulatory Visit: Payer: Self-pay | Admitting: Family Medicine

## 2020-08-27 DIAGNOSIS — I10 Essential (primary) hypertension: Secondary | ICD-10-CM

## 2020-08-31 ENCOUNTER — Encounter: Payer: Self-pay | Admitting: Family Medicine

## 2020-08-31 ENCOUNTER — Ambulatory Visit: Payer: BC Managed Care – PPO | Admitting: Family Medicine

## 2020-08-31 ENCOUNTER — Other Ambulatory Visit: Payer: Self-pay

## 2020-08-31 VITALS — BP 134/68 | HR 75 | Temp 98.0°F | Ht 68.0 in | Wt 187.0 lb

## 2020-08-31 DIAGNOSIS — Z6828 Body mass index (BMI) 28.0-28.9, adult: Secondary | ICD-10-CM

## 2020-08-31 DIAGNOSIS — I10 Essential (primary) hypertension: Secondary | ICD-10-CM | POA: Diagnosis not present

## 2020-08-31 DIAGNOSIS — Z85528 Personal history of other malignant neoplasm of kidney: Secondary | ICD-10-CM

## 2020-08-31 MED ORDER — AMLODIPINE BESY-BENAZEPRIL HCL 10-40 MG PO CAPS: ORAL_CAPSULE | ORAL | 3 refills | Status: DC

## 2020-08-31 NOTE — Progress Notes (Signed)
Subjective:  Patient ID: Troy Jacobs, male    DOB: 1970/12/23  Age: 50 y.o. MRN: [id number]  Chief Complaint  Patient presents with   Hypertension    HPI HTN- taking Lotrel 10-40 mg daily. BP is well controlled. Eats healthy.   History of right renal carcinoma status post right partial nephrectomy in 2019.  Patient follows with urology. Patient refuses flu vaccine and COVID vaccinations. Pt sees dentist twice a yr.  Pt does not see eye doctor. Has CDL every 2 yrs.   No current outpatient medications on file prior to visit.   No current facility-administered medications on file prior to visit.   Past Medical History:  Diagnosis Date   Cancer ([place]) 07/2017   Rt partial nephrectomy   Hypertension    Past Surgical History:  Procedure Laterality Date   COLONOSCOPY  12/29/2012   normal   ROBOTIC ASSITED PARTIAL NEPHRECTOMY Left 07/24/2017   Procedure: XI ROBOTIC ASSITED PARTIAL NEPHRECTOMY;  Surgeon: Alexis Frock, MD;  Location: [facility];  Service: Urology;  Laterality: Left;   WISDOM TOOTH EXTRACTION      Family History  Problem Relation Age of Onset   Hypertension Father    Social History   Socioeconomic History   Marital status: Married    Spouse name: Not on file   Number of children: Not on file   Years of education: Not on file   Highest education level: Not on file  Occupational History   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    Drug use: No   Sexual activity: Yes  Other Topics Concern   Not on file  Social History Narrative   Not on file   Social Determinants of Health   Financial Resource Strain: Not on file  Food Insecurity: Not on file  Transportation Needs: Not on file  Physical Activity: Not on file  Stress:  Not on file  Social Connections: Not on file    Review of Systems  Constitutional:  Negative for chills, diaphoresis, fatigue and fever.  HENT:  Negative for congestion, ear pain and sore throat.   Respiratory:  Negative for cough and shortness of breath.   Cardiovascular:  Negative for chest pain and leg swelling.  Gastrointestinal:  Negative for abdominal pain, constipation, diarrhea, nausea and vomiting.  Genitourinary:  Negative for dysuria and urgency.  Musculoskeletal:  Negative for arthralgias and myalgias.  Neurological:  Negative for dizziness and headaches.  Psychiatric/Behavioral:  Negative for dysphoric mood.     Objective:  BP 134/68   Pulse 75   Temp 98 F (36.7 C)   Ht '5\' 8"'$  (1.727 m)   Wt 187 lb (84.8 kg)   SpO2 99%   BMI 28.43 kg/m   BP/Weight 08/31/2020 09/02/2019 [id number]  Systolic BP [id number]  Diastolic BP 68 78 70  Wt. (Lbs) 187 186.4 -  BMI 28.43 28.34 -    Physical Exam Vitals reviewed.  Constitutional:      Appearance: Normal appearance. He is normal weight.  Cardiovascular:     Rate and Rhythm: Normal rate and regular rhythm.     Heart sounds: No murmur heard. Pulmonary:     Effort: Pulmonary effort  is normal.     Breath sounds: Normal breath sounds.  Abdominal:     General: Abdomen is flat. Bowel sounds are normal.     Palpations: Abdomen is soft.     Tenderness: There is no abdominal tenderness.  Neurological:     Mental Status: He is alert and oriented to person, place, and time.  Psychiatric:        Mood and Affect: Mood normal.        Behavior: Behavior normal.    Diabetic Foot Exam - Simple   No data filed      Lab Results  Component Value Date   WBC 4.4 09/02/2019   HGB 16.2 09/02/2019   HCT 45.1 09/02/2019   PLT 289 09/02/2019   GLUCOSE 100 (H) 09/02/2019   CHOL 189 09/02/2019   TRIG 102 09/02/2019   HDL 48 09/02/2019   LDLCALC 123 (H) 09/02/2019   ALT 59 (H) 09/02/2019   AST 27 09/02/2019   NA 140 09/02/2019    K 4.0 09/02/2019   CL 102 09/02/2019   CREATININE 0.91 09/02/2019   BUN 10 09/02/2019   CO2 25 09/02/2019   TSH 2.280 09/02/2019      Assessment & Plan:   1. Essential hypertension, benign Well controlled.  No changes to medicines.  Continue to work on eating a healthy diet and exercise.  Labs drawn today.  - Comprehensive metabolic panel - CBC with Differential/Platelet - Lipid panel - amLODipine-benazepril (LOTREL) 10-40 MG capsule; TAKE 1 CAPSULE BY MOUTH EVERY DAY  Dispense: 90 capsule; Refill: 3 - TSH  2. History of renal carcinoma Continue monitoring by [facility].   3. BMI 28.0-28.9,adult  Recommend continue to work on eating healthy diet and exercise.  Meds ordered this encounter  Medications   amLODipine-benazepril (LOTREL) 10-40 MG capsule    Sig: TAKE 1 CAPSULE BY MOUTH EVERY DAY    Dispense:  90 capsule    Refill:  3     Orders Placed This Encounter  Procedures   Comprehensive metabolic panel   CBC with Differential/Platelet   Lipid panel   TSH      Follow-up: Return in about 1 year (around 08/31/2021) for follow up.  An After Visit Summary was printed and given to the patient.  [name], MD [facility] [phone]

## 2020-09-01 ENCOUNTER — Other Ambulatory Visit: Payer: Self-pay

## 2020-09-01 DIAGNOSIS — I10 Essential (primary) hypertension: Secondary | ICD-10-CM

## 2020-09-01 LAB — CBC WITH DIFFERENTIAL/PLATELET
Basophils Absolute: 0 10*3/uL (ref 0.0–0.2)
Basos: 0 %
EOS (ABSOLUTE): 0.2 10*3/uL (ref 0.0–0.4)
Eos: 4 %
Hematocrit: 44.7 % (ref 37.5–51.0)
Hemoglobin: 15.7 g/dL (ref 13.0–17.7)
Immature Grans (Abs): 0 10*3/uL (ref 0.0–0.1)
Immature Granulocytes: 0 %
Lymphocytes Absolute: 1.3 10*3/uL (ref 0.7–3.1)
Lymphs: 25 %
MCH: 30.3 pg (ref 26.6–33.0)
MCHC: 35.1 g/dL (ref 31.5–35.7)
MCV: 86 fL (ref 79–97)
Monocytes Absolute: 0.4 10*3/uL (ref 0.1–0.9)
Monocytes: 8 %
Neutrophils Absolute: 3.3 10*3/uL (ref 1.4–7.0)
Neutrophils: 63 %
Platelets: 283 10*3/uL (ref 150–450)
RBC: 5.18 x10E6/uL (ref 4.14–5.80)
RDW: 12.6 % (ref 11.6–15.4)
WBC: 5.1 10*3/uL (ref 3.4–10.8)

## 2020-09-01 LAB — COMPREHENSIVE METABOLIC PANEL
ALT: 32 IU/L (ref 0–44)
AST: 15 IU/L (ref 0–40)
Albumin/Globulin Ratio: 2.5 — ABNORMAL HIGH (ref 1.2–2.2)
Albumin: 4.9 g/dL (ref 4.0–5.0)
Alkaline Phosphatase: 81 IU/L (ref 44–121)
BUN/Creatinine Ratio: 12 (ref 9–20)
BUN: 11 mg/dL (ref 6–24)
Bilirubin Total: 0.7 mg/dL (ref 0.0–1.2)
CO2: 24 mmol/L (ref 20–29)
Calcium: 9.3 mg/dL (ref 8.7–10.2)
Chloride: 102 mmol/L (ref 96–106)
Creatinine, Ser: 0.95 mg/dL (ref 0.76–1.27)
Globulin, Total: 2 g/dL (ref 1.5–4.5)
Glucose: 101 mg/dL — ABNORMAL HIGH (ref 65–99)
Potassium: 4.4 mmol/L (ref 3.5–5.2)
Sodium: 140 mmol/L (ref 134–144)
Total Protein: 6.9 g/dL (ref 6.0–8.5)
eGFR: 98 mL/min/{1.73_m2} (ref >59)

## 2020-09-01 LAB — LIPID PANEL
Chol/HDL Ratio: 3.9 ratio (ref 0.0–5.0)
Cholesterol, Total: 207 mg/dL — ABNORMAL HIGH (ref 100–199)
HDL: 53 mg/dL (ref >39)
LDL Chol Calc (NIH): 138 mg/dL — ABNORMAL HIGH (ref 0–99)
Triglycerides: 87 mg/dL (ref 0–149)
VLDL Cholesterol Cal: 16 mg/dL (ref 5–40)

## 2020-09-01 LAB — HGB A1C W/O EAG: Hgb A1c MFr Bld: 5.4 % (ref 4.8–5.6)

## 2020-09-01 LAB — SPECIMEN STATUS REPORT

## 2020-09-01 LAB — TSH: TSH: 2.33 u[IU]/mL (ref 0.450–4.500)

## 2020-09-01 LAB — CARDIOVASCULAR RISK ASSESSMENT

## 2020-09-01 MED ORDER — ATORVASTATIN CALCIUM 10 MG PO TABS: 10.0000 mg | ORAL_TABLET | Freq: Every day | ORAL | 2 refills | Status: DC

## 2020-09-15 ENCOUNTER — Other Ambulatory Visit: Payer: Self-pay

## 2020-09-15 ENCOUNTER — Ambulatory Visit: Payer: BC Managed Care – PPO | Admitting: Family Medicine

## 2020-09-15 VITALS — BP 132/78 | HR 72 | Temp 97.3°F | Resp 18 | Ht 68.0 in | Wt 183.6 lb

## 2020-09-15 DIAGNOSIS — G4701 Insomnia due to medical condition: Secondary | ICD-10-CM

## 2020-09-15 DIAGNOSIS — R002 Palpitations: Secondary | ICD-10-CM

## 2020-09-15 DIAGNOSIS — R0681 Apnea, not elsewhere classified: Secondary | ICD-10-CM | POA: Diagnosis not present

## 2020-09-15 NOTE — Progress Notes (Signed)
Acute Office Visit  Subjective:    Patient ID: Troy Jacobs, male    DOB: 08/07/1970, 50 y.o.   MRN: [id number]  Chief Complaint  Patient presents with   Palpitations    HPI Patient is in today for poor sleep. Awakens every 1 hour. Wife says he snores a lot. He has awakened in the middle of the night 2-3 times in the last couple of weeks with heart racing and sob. No chest pain. Lasted for about 10 minutes usually, but one morning lasted 2 hours. No dizziness or light headed.   Past Medical History:  Diagnosis Date   Cancer ([place]) 07/2017   Rt partial nephrectomy   Hypertension     Past Surgical History:  Procedure Laterality Date   COLONOSCOPY  12/29/2012   normal   ROBOTIC ASSITED PARTIAL NEPHRECTOMY Left 07/24/2017   Procedure: XI ROBOTIC ASSITED PARTIAL NEPHRECTOMY;  Surgeon: Alexis Frock, MD;  Location: [facility];  Service: Urology;  Laterality: Left;   WISDOM TOOTH EXTRACTION      Family History  Problem Relation Age of Onset   Hypertension Father     Social History   Socioeconomic History   Marital status: Married    Spouse name: Not on file   Number of children: Not on file   Years of education: Not on file   Highest education level: Not on file  Occupational History   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    Drug use: No   Sexual activity: Yes  Other Topics Concern   Not on file  Social History Narrative   Not on file   Social Determinants of Health   Financial Resource Strain: Not on file  Food Insecurity: Not on file  Transportation Needs: Not on file  Physical Activity: Not on file  Stress: Not on file  Social Connections: Not on file  Intimate Partner Violence: Not on file    Outpatient Medications  Prior to Visit  Medication Sig Dispense Refill   amLODipine-benazepril (LOTREL) 10-40 MG capsule TAKE 1 CAPSULE BY MOUTH EVERY DAY 90 capsule 3   atorvastatin (LIPITOR) 10 MG tablet Take 1 tablet (10 mg total) by mouth daily. 30 tablet 2   No facility-administered medications prior to visit.    No Known Allergies  Review of Systems  Constitutional:  Negative for chills and fever.  HENT:  Negative for congestion, rhinorrhea and sore throat.   Respiratory:  Positive for shortness of breath. Negative for cough.   Cardiovascular:  Positive for palpitations. Negative for chest pain.  Gastrointestinal:  Negative for abdominal pain, constipation, diarrhea, nausea and vomiting.  Genitourinary:  Negative for dysuria and urgency.  Musculoskeletal:  Negative for arthralgias, back pain and myalgias.  Neurological:  Negative for dizziness and headaches.  Psychiatric/Behavioral:  Negative for dysphoric mood. The patient is not nervous/anxious.        Problems sleeping       Objective:    Physical Exam Vitals reviewed.  Constitutional:      Appearance: Normal appearance.  Cardiovascular:     Rate and Rhythm: Normal rate and regular rhythm.     Heart sounds: Normal heart sounds.  Pulmonary:  Effort: Pulmonary effort is normal.     Breath sounds: Normal breath sounds. No wheezing, rhonchi or rales.  Abdominal:     Tenderness: There is no abdominal tenderness.  Neurological:     Mental Status: He is alert.  Psychiatric:        Mood and Affect: Mood normal.        Behavior: Behavior normal.    BP 132/78   Pulse 72   Temp (!) 97.3 F (36.3 C)   Resp 18   Ht '5\' 8"'  (1.727 m)   Wt 183 lb 9.6 oz (83.3 kg)   BMI 27.92 kg/m  Wt Readings from Last 3 Encounters:  09/15/20 183 lb 9.6 oz (83.3 kg)  08/31/20 187 lb (84.8 kg)  09/02/19 186 lb 6.4 oz (84.6 kg)    There are no preventive care reminders to display for this patient.  There are no preventive care reminders to display for  this patient.   Lab Results  Component Value Date   TSH 2.330 08/31/2020   Lab Results  Component Value Date   WBC 5.1 08/31/2020   HGB 15.7 08/31/2020   HCT 44.7 08/31/2020   MCV 86 08/31/2020   PLT 283 08/31/2020   Lab Results  Component Value Date   NA 140 08/31/2020   K 4.4 08/31/2020   CO2 24 08/31/2020   GLUCOSE 101 (H) 08/31/2020   BUN 11 08/31/2020   CREATININE 0.95 08/31/2020   BILITOT 0.7 08/31/2020   ALKPHOS 81 08/31/2020   AST 15 08/31/2020   ALT 32 08/31/2020   PROT 6.9 08/31/2020   ALBUMIN 4.9 08/31/2020   CALCIUM 9.3 08/31/2020   ANIONGAP 8 07/25/2017   EGFR 98 08/31/2020   Lab Results  Component Value Date   CHOL 207 (H) 08/31/2020   Lab Results  Component Value Date   HDL 53 08/31/2020   Lab Results  Component Value Date   LDLCALC 138 (H) 08/31/2020   Lab Results  Component Value Date   TRIG 87 08/31/2020   Lab Results  Component Value Date   CHOLHDL 3.9 08/31/2020   Lab Results  Component Value Date   HGBA1C 5.4 08/31/2020       Assessment & Plan:   Problem List Items Addressed This Visit       Other   Palpitations - Primary    Needs holtor monitor and in house sleep study.       Relevant Orders   EKG 12-Lead   LONG TERM MONITOR (3-14 DAYS)   Apnea    Witnessed by wife.  Need in house sleep study due to palpitations.      Relevant Orders   PSG Sleep Study   Insomnia due to medical condition    Recommend melatonin otc.  I would avoid rx sleep medicines until evaluations for sleep apnea and arrhythmias arecompleted.         Follow-up: Return if symptoms worsen or fail to improve.  An After Visit Summary was printed and given to the patient.  [name], MD [facility] [phone]

## 2020-09-15 NOTE — Patient Instructions (Signed)
Order holtor monitor.  Order sleep study in sleep lab.  Recommend melatonin otc for sleep.

## 2020-09-25 ENCOUNTER — Encounter: Payer: Self-pay | Admitting: Family Medicine

## 2020-09-25 DIAGNOSIS — G4701 Insomnia due to medical condition: Secondary | ICD-10-CM | POA: Insufficient documentation

## 2020-09-25 DIAGNOSIS — R002 Palpitations: Secondary | ICD-10-CM | POA: Insufficient documentation

## 2020-09-25 DIAGNOSIS — R0681 Apnea, not elsewhere classified: Secondary | ICD-10-CM

## 2020-09-25 HISTORY — DX: Apnea, not elsewhere classified: R06.81

## 2020-09-25 HISTORY — DX: Palpitations: R00.2

## 2020-09-25 HISTORY — DX: Insomnia due to medical condition: G47.01

## 2020-09-25 NOTE — Assessment & Plan Note (Addendum)
EKG NSR. No ST Changes. Needs holtor monitor and in house sleep study.

## 2020-09-25 NOTE — Assessment & Plan Note (Signed)
Recommend melatonin otc.  I would avoid rx sleep medicines until evaluations for sleep apnea and arrhythmias arecompleted.

## 2020-09-25 NOTE — Assessment & Plan Note (Signed)
Witnessed by wife.  Need in house sleep study due to palpitations.

## 2020-09-26 ENCOUNTER — Other Ambulatory Visit: Payer: Self-pay | Admitting: Family Medicine

## 2020-09-26 DIAGNOSIS — R002 Palpitations: Secondary | ICD-10-CM

## 2020-09-26 DIAGNOSIS — R0681 Apnea, not elsewhere classified: Secondary | ICD-10-CM

## 2020-10-04 ENCOUNTER — Ambulatory Visit (INDEPENDENT_AMBULATORY_CARE_PROVIDER_SITE_OTHER): Payer: BC Managed Care – PPO

## 2020-10-04 NOTE — Progress Notes (Unsigned)
Patient enrolled for [employer] to ship a 14 day ZIO XT monitor to his address on file.

## 2020-10-08 DIAGNOSIS — R002 Palpitations: Secondary | ICD-10-CM | POA: Diagnosis not present

## 2020-11-01 ENCOUNTER — Other Ambulatory Visit: Payer: Self-pay | Admitting: Family Medicine

## 2020-11-01 MED ORDER — METOPROLOL SUCCINATE ER 25 MG PO TB24: 25.0000 mg | ORAL_TABLET | Freq: Every day | ORAL | 2 refills | Status: DC

## 2020-11-02 ENCOUNTER — Other Ambulatory Visit: Payer: Self-pay

## 2020-11-02 DIAGNOSIS — I471 Supraventricular tachycardia: Secondary | ICD-10-CM

## 2020-11-12 ENCOUNTER — Other Ambulatory Visit: Payer: Self-pay | Admitting: Family Medicine

## 2020-12-02 ENCOUNTER — Other Ambulatory Visit: Payer: Self-pay

## 2020-12-02 ENCOUNTER — Ambulatory Visit (HOSPITAL_BASED_OUTPATIENT_CLINIC_OR_DEPARTMENT_OTHER): Payer: BC Managed Care – PPO | Attending: Family Medicine | Admitting: Internal Medicine

## 2020-12-02 VITALS — Ht 68.0 in | Wt 185.0 lb

## 2020-12-02 DIAGNOSIS — G4733 Obstructive sleep apnea (adult) (pediatric): Secondary | ICD-10-CM | POA: Diagnosis present

## 2020-12-02 DIAGNOSIS — R0683 Snoring: Secondary | ICD-10-CM | POA: Insufficient documentation

## 2020-12-02 DIAGNOSIS — R0681 Apnea, not elsewhere classified: Secondary | ICD-10-CM

## 2020-12-02 DIAGNOSIS — I10 Essential (primary) hypertension: Secondary | ICD-10-CM | POA: Insufficient documentation

## 2020-12-10 DIAGNOSIS — R0681 Apnea, not elsewhere classified: Secondary | ICD-10-CM

## 2020-12-10 NOTE — Procedures (Signed)
    Patient Name: Troy Jacobs, Troy Jacobs Date: 12/02/2020 Gender: Male D.O.B: 17-Oct-1970 Age (years): 41 Referring Provider: Dirk Dress Height (inches): 68 Interpreting Physician: Baird Lyons MD, ABSM Weight (lbs): 185 RPSGT: Earney Hamburg BMI: 28 MRN: [id number] Neck Size: 16.00  CLINICAL INFORMATION Sleep Study Type: NPSG Indication for sleep study: OSA with insomnia Epworth Sleepiness Score: 7  SLEEP STUDY TECHNIQUE As per the AASM Manual for the Scoring of Sleep and Associated Events v2.3 (April 2016) with a hypopnea requiring 4% desaturations.  The channels recorded and monitored were frontal, central and occipital EEG, electrooculogram (EOG), submentalis EMG (chin), nasal and oral airflow, thoracic and abdominal wall motion, anterior tibialis EMG, snore microphone, electrocardiogram, and pulse oximetry.  MEDICATIONS Medications self-administered by patient taken the night of the study : none reported  SLEEP ARCHITECTURE The study was initiated at 10:29:03 PM and ended at 4:48:07 AM.  Sleep onset time was 20.9 minutes and the sleep efficiency was 22.7%%. The total sleep time was 86 minutes.  Stage REM latency was N/A minutes.  The patient spent 11.6%% of the night in stage N1 sleep, 88.4%% in stage N2 sleep, 0.0%% in stage N3 and 0% in REM.  Alpha intrusion was absent.  Supine sleep was 0.00%.  RESPIRATORY PARAMETERS The overall apnea/hypopnea index (AHI) was 20.9 per hour. There were 13 total apneas, including 13 obstructive, 0 central and 0 mixed apneas. There were 17 hypopneas and 31 RERAs.  The AHI during Stage REM sleep was N/A per hour.  AHI while supine was N/A per hour.  The mean oxygen saturation was 95.6%. The minimum SpO2 during sleep was 88.0%.  moderate snoring was noted during this study.  CARDIAC DATA The 2 lead EKG demonstrated sinus rhythm. The mean heart rate was 57.1 beats per minute. Other EKG findings include: None.  LEG MOVEMENT  DATA The total PLMS were 0 with a resulting PLMS index of 0.0. Associated arousal with leg movement index was 0.0 .  IMPRESSIONS - Sleep was fragmented with frequent arousals and awakenings. - Moderate obstructive sleep apnea occurred during this study (AHI = 20.9/h). - The patient had minimal or no oxygen desaturation during the study (Min O2 = 88.0%) Mean O2 saturation 96.1%. - The patient snored with moderate snoring volume. - No cardiac abnormalities were noted during this study. - Clinically significant periodic limb movements did not occur during sleep. No significant associated arousals.  DIAGNOSIS - Obstructive Sleep Apnea (G47.33)  RECOMMENDATIONS - Suggest CPAPtitration sleeep study or autopap. Other options, including a fitted oral appliance or ENT evaluation, would be based on clinical judgment. - Additional therapy for insomnia may be helpful. - Sleep hygiene should be reviewed to assess factors that may improve sleep quality. - Weight management and regular exercise should be initiated or continued if appropriate.  [Electronically signed] 12/10/2020 10:28 AM  Baird Lyons MD, ABSM Diplomate, American Board of Sleep Medicine   NPI: [id number]                         [place], [place] [facility]  ELECTRONICALLY SIGNED ON:  12/10/2020, 10:21 AM [place] PH: [phone]   FX: [phone] [place]

## 2020-12-19 ENCOUNTER — Other Ambulatory Visit: Payer: Self-pay | Admitting: Family Medicine

## 2020-12-21 ENCOUNTER — Telehealth: Payer: Self-pay | Admitting: Family Medicine

## 2020-12-21 NOTE — Telephone Encounter (Addendum)
Called pt for sleep study results, left voicemail that we have ordered an AutoPAP from [facility], advised pt to call me back  Spoke to pt and he is okay with trying out the AutoPAP. Order has been sent to [facility] patient

## 2020-12-21 NOTE — Telephone Encounter (Signed)
-----   Message from [name], MD sent at 12/14/2020  3:03 PM EST ----- Regarding: sleep study abnormal Sleep study is abnormal. C/w OSA. Recommend AutoPAP through [place].  Follow up in 2 months after getting cpap. Kc  ----- Message ----- From: Deneise Lever, MD Sent: 12/10/2020  10:31 AM EST To: [name], MD

## 2020-12-27 ENCOUNTER — Ambulatory Visit (INDEPENDENT_AMBULATORY_CARE_PROVIDER_SITE_OTHER): Payer: BC Managed Care – PPO | Admitting: Cardiology

## 2020-12-27 ENCOUNTER — Other Ambulatory Visit: Payer: Self-pay

## 2020-12-27 ENCOUNTER — Encounter: Payer: Self-pay | Admitting: Cardiology

## 2020-12-27 VITALS — BP 120/78 | HR 100 | Ht 68.5 in | Wt 193.4 lb

## 2020-12-27 DIAGNOSIS — R002 Palpitations: Secondary | ICD-10-CM | POA: Diagnosis not present

## 2020-12-27 DIAGNOSIS — E782 Mixed hyperlipidemia: Secondary | ICD-10-CM | POA: Insufficient documentation

## 2020-12-27 DIAGNOSIS — I10 Essential (primary) hypertension: Secondary | ICD-10-CM

## 2020-12-27 DIAGNOSIS — I471 Supraventricular tachycardia: Secondary | ICD-10-CM | POA: Diagnosis not present

## 2020-12-27 DIAGNOSIS — R011 Cardiac murmur, unspecified: Secondary | ICD-10-CM

## 2020-12-27 NOTE — Addendum Note (Signed)
Addended by: Truddie Hidden on: 12/27/2020 09:59 AM   Modules accepted: Orders

## 2020-12-27 NOTE — Patient Instructions (Signed)
Medication Instructions:  Your physician recommends that you continue on your current medications as directed. Please refer to the Current Medication list given to you today.  *If you need a refill on your cardiac medications before your next appointment, please call your pharmacy*   Lab Work: None ordered If you have labs (blood work) drawn today and your tests are completely normal, you will receive your results only by: [place] (if you have MyChart) OR A paper copy in the mail If you have any lab test that is abnormal or we need to change your treatment, we will call you to review the results.   Testing/Procedures: Your physician has requested that you have an echocardiogram. Echocardiography is a painless test that uses sound waves to create images of your heart. It provides your doctor with information about the size and shape of your heart and how well your hearts chambers and valves are working. This procedure takes approximately one hour. There are no restrictions for this procedure.  We will order CT coronary calcium score. It will cost $99.00 and is not covered by insurance.  Please call [phone] to schedule.   [facility]  [address], [place] [address]   Follow-Up: At [facility], you and your health needs are our priority.  As part of our continuing mission to provide you with exceptional heart care, we have created designated Provider Care Teams.  These Care Teams include your primary Cardiologist (physician) and Advanced Practice Providers (APPs -  Physician Assistants and Nurse Practitioners) who all work together to provide you with the care you need, when you need it.  We recommend signing up for the patient portal called "MyChart".  Sign up information is provided on this After Visit Summary.  MyChart is used to connect with patients for Virtual Visits (Telemedicine).  Patients are able to view lab/test results, encounter notes,  upcoming appointments, etc.  Non-urgent messages can be sent to your provider as well.   To learn more about what you can do with MyChart, go to [email or web address].    Your next appointment:   6 month(s)  The format for your next appointment:   In Person  Provider:   Jyl Heinz, MD   Other Instructions Echocardiogram An echocardiogram is a test that uses sound waves (ultrasound) to produce images of the heart. Images from an echocardiogram can provide important information about: Heart size and shape. The size and thickness and movement of your heart's walls. Heart muscle function and strength. Heart valve function or if you have stenosis. Stenosis is when the heart valves are too narrow. If blood is flowing backward through the heart valves (regurgitation). A tumor or infectious growth around the heart valves. Areas of heart muscle that are not working well because of poor blood flow or injury from a heart attack. Aneurysm detection. An aneurysm is a weak or damaged part of an artery wall. The wall bulges out from the normal force of blood pumping through the body. Tell a health care provider about: Any allergies you have. All medicines you are taking, including vitamins, herbs, eye drops, creams, and over-the-counter medicines. Any blood disorders you have. Any surgeries you have had. Any medical conditions you have. Whether you are pregnant or may be pregnant. What are the risks? Generally, this is a safe test. However, problems may occur, including an allergic reaction to dye (contrast) that may be used during the test. What happens before the test? No  specific preparation is needed. You may eat and drink normally. What happens during the test? You will take off your clothes from the waist up and put on a hospital gown. Electrodes or electrocardiogram (ECG)patches may be placed on your chest. The electrodes or patches are then connected to a device that monitors  your heart rate and rhythm. You will lie down on a table for an ultrasound exam. A gel will be applied to your chest to help sound waves pass through your skin. A handheld device, called a transducer, will be pressed against your chest and moved over your heart. The transducer produces sound waves that travel to your heart and bounce back (or "echo" back) to the transducer. These sound waves will be captured in real-time and changed into images of your heart that can be viewed on a video monitor. The images will be recorded on a computer and reviewed by your health care provider. You may be asked to change positions or hold your breath for a short time. This makes it easier to get different views or better views of your heart. In some cases, you may receive contrast through an IV in one of your veins. This can improve the quality of the pictures from your heart. The procedure may vary among health care providers and hospitals.   What can I expect after the test? You may return to your normal, everyday life, including diet, activities, and medicines, unless your health care provider tells you not to do that. Follow these instructions at home: It is up to you to get the results of your test. Ask your health care provider, or the department that is doing the test, when your results will be ready. Keep all follow-up visits. This is important. Summary An echocardiogram is a test that uses sound waves (ultrasound) to produce images of the heart. Images from an echocardiogram can provide important information about the size and shape of your heart, heart muscle function, heart valve function, and other possible heart problems. You do not need to do anything to prepare before this test. You may eat and drink normally. After the echocardiogram is completed, you may return to your normal, everyday life, unless your health care provider tells you not to do that. This information is not intended to replace  advice given to you by your health care provider. Make sure you discuss any questions you have with your health care provider. Document Revised: 08/11/2019 Document Reviewed: 08/11/2019 Elsevier Patient Education  2021 [employer].

## 2020-12-27 NOTE — Progress Notes (Signed)
Cardiology Office Note:    Date:  12/27/2020   ID:  Troy Jacobs, DOB [birth year], MRN [id number]  PCP:  [name], MD  Cardiologist:  Jenean Lindau, MD   Referring MD: [name], MD    ASSESSMENT:    1. Primary hypertension   2. Palpitations   3. Mixed dyslipidemia   4. PSVT (paroxysmal supraventricular tachycardia) (HCC)   5. Cardiac murmur    PLAN:    In order of problems listed above:  Primary prevention stressed with the patient.  Importance of compliance with diet medication stressed and he vocalized understanding. Essential hypertension: Blood pressure stable and diet was emphasized.  Lifestyle modification urged.  Weight reduction was also stressed.  He has abdominal obesity. Mixed dyslipidemia: Lipids are markedly elevated therefore we will do a calcium scoring to assess risk for coronary artery disease.  We will advise him about lipid-lowering management accordingly.  In the interim he was advised to walk at least half an hour a day 5 days a week and he promises to do so. Sleep apnea: Sleep health issues were discussed and compliance urged. Palpitations: Paroxysmal supraventricular tachycardia: Not a significant issue at this time.  He is not on beta-blockers.  Hopefully lifestyle modification and using sleep apnea equipment regularly will help this.  He promises to do better with compliance. Cardiac murmur: Echocardiogram will be done to assess murmur heard on auscultation. Patient will be seen in follow-up appointment in 6 months or earlier if the patient has any concerns    Medication Adjustments/Labs and Tests Ordered: Current medicines are reviewed at length with the patient today.  Concerns regarding medicines are outlined above.  No orders of the defined types were placed in this encounter.  No orders of the defined types were placed in this encounter.    History of Present Illness:    Troy Jacobs is a 50 y.o. male who is being seen today for  the evaluation of paroxysmal supraventricular tachycardia and palpitations at the request of Cox, Kirsten, MD. patient is a pleasant 50 year old male.  He has past medical history of essential hypertension, dyslipidemia.  He has sleep apnea.  He tested positive for sleep apnea during his study and he has been recommended a sleep apnea equipment to use at night.  He has not received..  His event monitor revealed minor bursts of supraventricular tachycardia and therefore he was sent for evaluation.  At the time of my evaluation, the patient is alert awake oriented and in no distress.  He does not exercise on a regular basis.  Sexual activity does not bring around any chest pain or chest tightness.  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   Palpitations 09/25/2020   Renal mass 7/86/7672   Umbilical hernia without obstruction and without gangrene 05/06/2017    Past Surgical History:  Procedure Laterality Date   COLONOSCOPY  12/29/2012   normal   ROBOTIC ASSITED PARTIAL NEPHRECTOMY Left 07/24/2017   Procedure: XI ROBOTIC ASSITED PARTIAL NEPHRECTOMY;  Surgeon: Alexis Frock, MD;  Location: [facility];  Service: Urology;  Laterality: Left;   WISDOM TOOTH EXTRACTION      Current Medications: Current Meds  Medication Sig   amLODipine-benazepril (LOTREL) 10-40 MG capsule TAKE 1 CAPSULE BY MOUTH EVERY DAY  Allergies:   Patient has no known allergies.   Social History   Socioeconomic History   Marital status: Married    Spouse name: Not on file   Number of children: Not on file   Years of education: Not on file   Highest education level: Not on file  Occupational History   Occupation: Truck Education administrator: [employer]    Comment: [employer]  Tobacco Use   Smoking status: Never     Passive exposure: Never   Smokeless tobacco: Never  Vaping Use   Vaping Use: Never used  Substance and Sexual Activity   Alcohol use: Yes    Alcohol/week: 0.0 standard drinks    Comment: occas.    Drug use: No   Sexual activity: Yes  Other Topics Concern   Not on file  Social History Narrative   Not on file   Social Determinants of Health   Financial Resource Strain: Not on file  Food Insecurity: Not on file  Transportation Needs: Not on file  Physical Activity: Not on file  Stress: Not on file  Social Connections: Not on file     Family History: The patient's family history includes Hypertension in his father.  ROS:   Please see the history of present illness.    All other systems reviewed and are negative.  EKGs/Labs/Other Studies Reviewed:    The following studies were reviewed today: EKG reveals sinus rhythm and nonspecific ST-T changes   Recent Labs: 08/31/2020: ALT 32; BUN 11; Creatinine, Ser 0.95; Hemoglobin 15.7; Platelets 283; Potassium 4.4; Sodium 140; TSH 2.330  Recent Lipid Panel    Component Value Date/Time   CHOL 207 (H) 08/31/2020 0857   TRIG 87 08/31/2020 0857   HDL 53 08/31/2020 0857   CHOLHDL 3.9 08/31/2020 0857   LDLCALC 138 (H) 08/31/2020 0857    Physical Exam:    VS:  BP 120/78    Pulse 100    Ht 5' 8.5" (1.74 m)    Wt 193 lb 6.4 oz (87.7 kg)    SpO2 98%    BMI 28.98 kg/m     Wt Readings from Last 3 Encounters:  12/27/20 193 lb 6.4 oz (87.7 kg)  12/02/20 185 lb (83.9 kg)  09/15/20 183 lb 9.6 oz (83.3 kg)     GEN: Patient is in no acute distress HEENT: Normal NECK: No JVD; No carotid bruits LYMPHATICS: No lymphadenopathy CARDIAC: S1 S2 regular, 2/6 systolic murmur at the apex. RESPIRATORY:  Clear to auscultation without rales, wheezing or rhonchi  ABDOMEN: Soft, non-tender, non-distended MUSCULOSKELETAL:  No edema; No deformity  SKIN: Warm and dry NEUROLOGIC:  Alert and oriented x 3 PSYCHIATRIC:  Normal affect     Signed, Jenean Lindau, MD  12/27/2020 9:53 AM    [place]

## 2020-12-29 ENCOUNTER — Other Ambulatory Visit: Payer: Self-pay | Admitting: Family Medicine

## 2020-12-29 DIAGNOSIS — G4733 Obstructive sleep apnea (adult) (pediatric): Secondary | ICD-10-CM

## 2021-01-10 ENCOUNTER — Other Ambulatory Visit: Payer: Self-pay

## 2021-01-10 ENCOUNTER — Ambulatory Visit (INDEPENDENT_AMBULATORY_CARE_PROVIDER_SITE_OTHER): Payer: BC Managed Care – PPO

## 2021-01-10 DIAGNOSIS — R011 Cardiac murmur, unspecified: Secondary | ICD-10-CM

## 2021-01-10 LAB — ECHOCARDIOGRAM COMPLETE
Area-P 1/2: 4.99 cm2
P 1/2 time: 491 msec
S' Lateral: 2.5 cm

## 2021-01-31 ENCOUNTER — Telehealth: Payer: Self-pay | Admitting: Internal Medicine

## 2021-02-01 DIAGNOSIS — G4733 Obstructive sleep apnea (adult) (pediatric): Secondary | ICD-10-CM | POA: Insufficient documentation

## 2021-02-01 NOTE — Progress Notes (Signed)
Subjective:  Patient ID: Troy Jacobs, male    DOB: 02/23/1970  Age: 51 y.o. MRN: [id number]  Chief Complaint  Patient presents with   Sleep Apnea   Hyperlipidemia   Hypertension    HPI Patient saw Dr Geraldo Pitter on 12/27/2020. He ordered Echocardiogram, CT coronary calcium score. Patient is not taking metoprolol 25 mg daily. He is taking amlodipine-benazepril 10-40 mg everyday for Hypertension.  Hyperlipidemia: Did not tolerate lipitor. Made him feel bad. Stopped it. Eating healthy  Patient had sleep study at [facility] and had a terrible experience. Heat went out and froze all night. Did not sleep, but was diagnosed with apnea. I ordered APAP, but patient is unable to tolerate. Makes him claustrophic and congested.  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    Current Outpatient Medications on File Prior to Visit  Medication Sig Dispense Refill   amLODipine-benazepril (LOTREL) 10-40 MG capsule TAKE 1 CAPSULE BY MOUTH EVERY DAY 90 capsule 3   No current facility-administered medications on file prior to visit.   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   Palpitations 09/25/2020   Renal mass 7/51/0258   Umbilical hernia without obstruction and without gangrene 05/06/2017   Past Surgical History:  Procedure Laterality Date   COLONOSCOPY  12/29/2012   normal   ROBOTIC ASSITED PARTIAL NEPHRECTOMY Left 07/24/2017   Procedure: XI ROBOTIC ASSITED PARTIAL NEPHRECTOMY;  Surgeon: Alexis Frock, MD;  Location: [facility];  Service: Urology;  Laterality: Left;   WISDOM TOOTH EXTRACTION      Family History  Problem Relation Age of Onset   Hypertension Father    Social History    Socioeconomic History   Marital status: Married    Spouse name: Not on file   Number of children: Not on file   Years of education: Not on file   Highest education level: Not on file  Occupational History   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    Drug use: No   Sexual activity: Yes  Other Topics Concern   Not on file  Social History Narrative   Not on file   Social Determinants of Health   Financial Resource Strain: Not on file  Food Insecurity: Not on file  Transportation Needs: Not on file  Physical Activity: Not on file  Stress: Not on file  Social Connections: Not on file    Review of Systems  Constitutional:  Negative for chills and fever.  HENT:  Negative for congestion, rhinorrhea and sore throat.   Respiratory:  Negative for cough and shortness of breath.   Cardiovascular:  Negative for chest pain and palpitations.  Gastrointestinal:  Negative for abdominal pain, constipation, diarrhea, nausea and vomiting.  Genitourinary:  Negative for dysuria and urgency.  Musculoskeletal:  Negative for arthralgias, back pain and myalgias.  Neurological:  Negative for dizziness and headaches.  Psychiatric/Behavioral:  Negative for dysphoric mood. The patient is not nervous/anxious.     Objective:  BP 120/80    Pulse 90    Temp (!) 97.2 F (36.2 C)    Resp 18    Ht 5\' 8"  (1.727 m)    Wt 189 lb (85.7 kg)    SpO2 97%    BMI 28.74 kg/m   BP/Weight 02/02/2021 12/27/2020 93/07/1694  Systolic BP 789 381 -  Diastolic BP 80 78 -  Wt. (Lbs) 189 193.4 185  BMI 28.74 28.98 28.13    Physical Exam Vitals reviewed.  Constitutional:      Appearance: Normal appearance.  Neck:     Vascular: No carotid bruit.   Cardiovascular:     Rate and Rhythm: Normal rate and regular rhythm.     Heart sounds: Normal heart sounds.  Pulmonary:     Effort: Pulmonary effort is normal.     Breath sounds: Normal breath sounds. No wheezing, rhonchi or rales.  Abdominal:     General: Bowel sounds are normal.     Palpations: Abdomen is soft.     Tenderness: There is no abdominal tenderness.  Neurological:     Mental Status: He is alert.  Psychiatric:        Mood and Affect: Mood normal.        Behavior: Behavior normal.    Diabetic Foot Exam - Simple   No data filed      Lab Results  Component Value Date   WBC 4.6 02/02/2021   HGB 15.9 02/02/2021   HCT 46.3 02/02/2021   PLT 323 02/02/2021   GLUCOSE 107 (H) 02/02/2021   CHOL 181 02/02/2021   TRIG 57 02/02/2021   HDL 43 02/02/2021   LDLCALC 127 (H) 02/02/2021   ALT 44 02/02/2021   AST 20 02/02/2021   NA 142 02/02/2021   K 4.2 02/02/2021   CL 104 02/02/2021   CREATININE 1.00 02/02/2021   BUN 12 02/02/2021   CO2 26 02/02/2021   TSH 2.330 08/31/2020   HGBA1C 5.4 08/31/2020      Assessment & Plan:   Problem List Items Addressed This Visit       Cardiovascular and Mediastinum   Hypertension    Well controlled.  No changes to medicines. Continue amlodipine-benazepril 10-40 mg daily   Continue to work on eating a healthy diet and exercise.  Labs drawn today.          Respiratory   OSA (obstructive sleep apnea) - Primary    Sleep study: I am concerned is not valid do to difficulty of administration. Patient did not sleep more than 1 hr and 20 minutes by my understanding.  Intolerant to APAP.  I discussed with Dr. Annamaria Boots, the director of sleep lab at [facility].  Even though the sleep study was brief, he reviewed it and it is concerning for OSA.  I will discuss with sleep lab about rescheduling either a repeat sleep study at no cost or a home sleep study at no cost.  Patient's preference is a home sleep study.  In addition, I may get  him scheduled for a mask sensitization test following repeat sleep study.  In addition, will refer to Dr. Annamaria Boots.       Relevant Orders   Ambulatory referral to Pulmonology     Other   Palpitations    Resolved.  Cardiology work up was negative.      Mixed hyperlipidemia    Await labs/testing  for assessment and recommendations. Intolerant to lipitor.  Continue to work on eating a healthy diet and exercise.  Labs drawn today.        Relevant Orders   Lipid panel (Completed)   CBC with Differential/Platelet (Completed)   Comprehensive metabolic panel (Completed)  .  No orders of the defined types were placed in this encounter.   Orders Placed This Encounter  Procedures   Lipid panel   CBC with Differential/Platelet   Comprehensive metabolic panel   Cardiovascular Risk Assessment   Ambulatory referral to Pulmonology     Follow-up: Return in 3 months (on 05/02/2021) for chronic fasting.  An After Visit Summary was printed and given to the patient.  [name], MD [facility] [phone]

## 2021-02-01 NOTE — Telephone Encounter (Signed)
Spoke to patient, who has questions regarding sleep study.  Patient has not been seen by our office previously. I recommended that he contact Dr. Tobie Poet, as she ordered sleep study and cpap. He voiced his understanding.  Nothing further needed.

## 2021-02-02 ENCOUNTER — Encounter: Payer: Self-pay | Admitting: Family Medicine

## 2021-02-02 ENCOUNTER — Other Ambulatory Visit: Payer: Self-pay

## 2021-02-02 ENCOUNTER — Ambulatory Visit: Payer: BC Managed Care – PPO | Admitting: Family Medicine

## 2021-02-02 VITALS — BP 120/80 | HR 90 | Temp 97.2°F | Resp 18 | Ht 68.0 in | Wt 189.0 lb

## 2021-02-02 DIAGNOSIS — R002 Palpitations: Secondary | ICD-10-CM

## 2021-02-02 DIAGNOSIS — E782 Mixed hyperlipidemia: Secondary | ICD-10-CM | POA: Diagnosis not present

## 2021-02-02 DIAGNOSIS — I10 Essential (primary) hypertension: Secondary | ICD-10-CM

## 2021-02-02 DIAGNOSIS — G4733 Obstructive sleep apnea (adult) (pediatric): Secondary | ICD-10-CM

## 2021-02-02 DIAGNOSIS — G4701 Insomnia due to medical condition: Secondary | ICD-10-CM

## 2021-02-02 NOTE — Assessment & Plan Note (Signed)
Resolved.  Cardiology work up was negative.

## 2021-02-02 NOTE — Assessment & Plan Note (Signed)
Await labs/testing for assessment and recommendations. Intolerant to lipitor.  Continue to work on eating a healthy diet and exercise.  Labs drawn today.

## 2021-02-02 NOTE — Assessment & Plan Note (Addendum)
Well controlled.  No changes to medicines. Continue amlodipine-benazepril 10-40 mg daily Continue to work on eating a healthy diet and exercise.  Labs drawn today.   

## 2021-02-02 NOTE — Assessment & Plan Note (Addendum)
Sleep study: I am concerned is not valid do to difficulty of administration. Patient did not sleep more than 1 hr and 20 minutes by my understanding.  Intolerant to APAP.  I discussed with Dr. Annamaria Boots, the director of sleep lab at [facility].  Even though the sleep study was brief, he reviewed it and it is concerning for OSA.  I will discuss with sleep lab about rescheduling either a repeat sleep study at no cost or a home sleep study at no cost.  Patient's preference is a home sleep study.  In addition, I may get him scheduled for a mask sensitization test following repeat sleep study.  In addition, will refer to Dr. Annamaria Boots.

## 2021-02-02 NOTE — Patient Instructions (Addendum)
Fat and Cholesterol Restricted Eating Plan Eating a diet that limits fat and cholesterol may help lower your risk for heart disease and other conditions. Your body needs fat and cholesterol for basic functions, but eating too much of these things can be harmful to your health. Your health care provider may order lab tests to check your blood fat (lipid) and cholesterol levels. This helps your health care provider understand your risk for certain conditions and whether you need to make diet changes. Work with your health care provider or dietitian to make an eating plan. What are tips for following this plan? General guidelines If you are overweight, work with your health care provider to lose weight safely. Losing just 5-10% of your body weight can improve your overall health and help prevent diseases such as diabetes and heart disease. Avoid: Foods with added sugar. Fried foods. Foods that contain partially hydrogenated oils, including stick margarine, some tub margarines, cookies, crackers, and other baked goods. If you drink alcohol: Limit how much you have to: 0-1 drink a day for women who are not pregnant. 0-2 drinks a day for men. Know how much alcohol is in a drink. In the [place] one drink equals one 12 oz bottle of beer (355 mL), one 5 oz glass of wine (148 mL), or one 1 oz glass of hard liquor (44 mL). Reading food labels Check food labels for: Trans fats or partially hydrogenated oils. Avoid foods that contain these. High amounts of saturated fat. Choose foods that are low in saturated fat (less than 2 g). The amount of cholesterol in each serving. The amount of fiber in each serving. Choose foods with healthy fats, such as: Monounsaturated and polyunsaturated fats. These include olive and canola oil, flaxseeds, walnuts, almonds, and seeds. Omega-3 fats. These are found in foods such as salmon, mackerel, sardines, tuna, flaxseed oil, and ground flaxseeds. Choose grain products that  have whole grains. Look for the word "whole" as the first word in the ingredient list. Cooking Cook foods using methods other than frying. Baking, boiling, grilling, and broiling are some healthy options. Eat more home-cooked food and less restaurant, buffet, and fast food. Avoid cooking using saturated fats. Animal sources of saturated fats include meats, butter, and cream. Plant sources of saturated fats include palm oil, palm kernel oil, and coconut oil. Meal planning  At meals, imagine dividing your plate into fourths: Fill one-half of your plate with vegetables, green salads, and fruit. Fill one-fourth of your plate with whole grains. Fill one-fourth of your plate with lean protein foods. Eat fish that is high in omega-3 fats at least two times a week. Eat more foods that contain fiber, such as whole grains, beans, apples, pears, berries, broccoli, carrots, peas, and barley. These foods help promote healthy cholesterol levels in the blood. What foods should I eat? Fruits All fresh, canned (in natural juice), or frozen fruits. Vegetables Fresh or frozen vegetables (raw, steamed, roasted, or grilled). Green salads. Grains Whole grains, such as whole wheat or whole grain breads, crackers, cereals, and pasta. Unsweetened oatmeal, bulgur, barley, quinoa, or brown rice. Corn or whole wheat flour tortillas. Meats and other proteins Ground beef (85% or leaner), grass-fed beef, or beef trimmed of fat. Skinless chicken or [place]. Ground chicken or [place]. Pork trimmed of fat. All fish and seafood. Egg whites. Dried beans, peas, or lentils. Unsalted nuts or seeds. Unsalted canned beans. Natural nut butters without added sugar and oil. Dairy Low-fat or nonfat dairy products, such as  skim or 1% milk, 2% or reduced-fat cheeses, low-fat and fat-free ricotta or cottage cheese, or plain low-fat and nonfat yogurt. Fats and oils Tub margarine without trans fats. Light or reduced-fat mayonnaise and salad  dressings. Avocado. Olive, canola, sesame, or safflower oils. The items listed above may not be a complete list of foods and beverages you can eat. Contact a dietitian for more information. What foods should I avoid? Fruits Canned fruit in heavy syrup. Fruit in cream or butter sauce. Fried fruit. Vegetables Vegetables cooked in cheese, cream, or butter sauce. Fried vegetables. Grains White bread. White pasta. White rice. Cornbread. Bagels, pastries, and croissants. Crackers and snack foods that contain trans fat and hydrogenated oils. Meats and other proteins Fatty cuts of meat. Ribs, chicken wings, bacon, sausage, bologna, salami, chitterlings, fatback, hot dogs, bratwurst, and packaged lunch meats. Liver and organ meats. Whole eggs and egg yolks. Chicken and [place] with skin. Fried meat. Dairy Whole or 2% milk, cream, half-and-half, and cream cheese. Whole milk cheeses. Whole-fat or sweetened yogurt. Full-fat cheeses. Nondairy creamers and whipped toppings. Processed cheese, cheese spreads, and cheese curds. Fats and oils Butter, stick margarine, lard, shortening, ghee, or bacon fat. Coconut, palm kernel, and palm oils. Beverages Alcohol. Sugar-sweetened drinks such as sodas, lemonade, and fruit drinks. Sweets and desserts Corn syrup, sugars, honey, and molasses. Candy. Jam and jelly. Syrup. Sweetened cereals. Cookies, pies, cakes, donuts, muffins, and ice cream. The items listed above may not be a complete list of foods and beverages you should avoid. Contact a dietitian for more information. Summary Your body needs fat and cholesterol for basic functions. However, eating too much of these things can be harmful to your health. Work with your health care provider and dietitian to follow a diet that limits fat and cholesterol. Doing this may help lower your risk for heart disease and other conditions. Choose healthy fats, such as monounsaturated and polyunsaturated fats, and foods high in  omega-3 fatty acids. Eat fiber-rich foods, such as whole grains, beans, peas, fruits, and vegetables. Limit or avoid alcohol, fried foods, and foods high in saturated fats, partially hydrogenated oils, and sugar. This information is not intended to replace advice given to you by your health care provider. Make sure you discuss any questions you have with your health care provider. Document Revised: 04/29/2020 Document Reviewed: 04/29/2020 Elsevier Patient Education  [place].

## 2021-02-03 ENCOUNTER — Other Ambulatory Visit: Payer: Self-pay | Admitting: Family Medicine

## 2021-02-03 ENCOUNTER — Inpatient Hospital Stay: Admission: RE | Admit: 2021-02-03 | Payer: BC Managed Care – PPO | Source: Ambulatory Visit

## 2021-02-03 LAB — CBC WITH DIFFERENTIAL/PLATELET
Basophils Absolute: 0 10*3/uL (ref 0.0–0.2)
Basos: 0 %
EOS (ABSOLUTE): 0.2 10*3/uL (ref 0.0–0.4)
Eos: 4 %
Hematocrit: 46.3 % (ref 37.5–51.0)
Hemoglobin: 15.9 g/dL (ref 13.0–17.7)
Immature Grans (Abs): 0 10*3/uL (ref 0.0–0.1)
Immature Granulocytes: 1 %
Lymphocytes Absolute: 1.2 10*3/uL (ref 0.7–3.1)
Lymphs: 26 %
MCH: 29.3 pg (ref 26.6–33.0)
MCHC: 34.3 g/dL (ref 31.5–35.7)
MCV: 85 fL (ref 79–97)
Monocytes Absolute: 0.4 10*3/uL (ref 0.1–0.9)
Monocytes: 8 %
Neutrophils Absolute: 2.8 10*3/uL (ref 1.4–7.0)
Neutrophils: 61 %
Platelets: 323 10*3/uL (ref 150–450)
RBC: 5.42 x10E6/uL (ref 4.14–5.80)
RDW: 12 % (ref 11.6–15.4)
WBC: 4.6 10*3/uL (ref 3.4–10.8)

## 2021-02-03 LAB — COMPREHENSIVE METABOLIC PANEL
ALT: 44 IU/L (ref 0–44)
AST: 20 IU/L (ref 0–40)
Albumin/Globulin Ratio: 2.3 — ABNORMAL HIGH (ref 1.2–2.2)
Albumin: 4.8 g/dL (ref 3.8–4.9)
Alkaline Phosphatase: 94 IU/L (ref 44–121)
BUN/Creatinine Ratio: 12 (ref 9–20)
BUN: 12 mg/dL (ref 6–24)
Bilirubin Total: 0.5 mg/dL (ref 0.0–1.2)
CO2: 26 mmol/L (ref 20–29)
Calcium: 9.3 mg/dL (ref 8.7–10.2)
Chloride: 104 mmol/L (ref 96–106)
Creatinine, Ser: 1 mg/dL (ref 0.76–1.27)
Globulin, Total: 2.1 g/dL (ref 1.5–4.5)
Glucose: 107 mg/dL — ABNORMAL HIGH (ref 70–99)
Potassium: 4.2 mmol/L (ref 3.5–5.2)
Sodium: 142 mmol/L (ref 134–144)
Total Protein: 6.9 g/dL (ref 6.0–8.5)
eGFR: 91 mL/min/{1.73_m2} (ref >59)

## 2021-02-03 LAB — LIPID PANEL
Chol/HDL Ratio: 4.2 ratio (ref 0.0–5.0)
Cholesterol, Total: 181 mg/dL (ref 100–199)
HDL: 43 mg/dL (ref >39)
LDL Chol Calc (NIH): 127 mg/dL — ABNORMAL HIGH (ref 0–99)
Triglycerides: 57 mg/dL (ref 0–149)
VLDL Cholesterol Cal: 11 mg/dL (ref 5–40)

## 2021-02-03 LAB — CARDIOVASCULAR RISK ASSESSMENT

## 2021-02-03 MED ORDER — ROSUVASTATIN CALCIUM 5 MG PO TABS: 5.0000 mg | ORAL_TABLET | Freq: Every day | ORAL | 0 refills | Status: DC

## 2021-02-03 NOTE — Progress Notes (Signed)
Blood count normal.  Liver function normal.  Kidney function normal.  Cholesterol: LDL not at goal.  Start on rosuvastatin 5 mg before bed.  Sugar a little up. Limit sugar in diet. Last a1c 5.4 normal in 08/2020.  Recommend home sleep study as previous sleep study in lab was suboptimal.  I discussed with sleep lab director, Dr. Annamaria Boots.  Patient notified.

## 2021-02-03 NOTE — Telephone Encounter (Signed)
Refill sent to pharmacy.   

## 2021-02-06 ENCOUNTER — Encounter: Payer: Self-pay | Admitting: Family Medicine

## 2021-02-07 NOTE — Telephone Encounter (Signed)
Resent via mychart

## 2021-03-24 ENCOUNTER — Other Ambulatory Visit: Payer: Self-pay

## 2021-03-24 ENCOUNTER — Other Ambulatory Visit (HOSPITAL_COMMUNITY): Payer: Self-pay | Admitting: Urology

## 2021-03-24 ENCOUNTER — Ambulatory Visit (HOSPITAL_COMMUNITY)
Admission: RE | Admit: 2021-03-24 | Discharge: 2021-03-24 | Disposition: A | Discharge status: Home / Self Care | Payer: BC Managed Care – PPO | Source: Ambulatory Visit | Attending: Urology | Admitting: Urology

## 2021-03-24 DIAGNOSIS — C642 Malignant neoplasm of left kidney, except renal pelvis: Secondary | ICD-10-CM | POA: Insufficient documentation

## 2021-05-05 ENCOUNTER — Other Ambulatory Visit: Payer: Self-pay | Admitting: Family Medicine

## 2021-06-07 ENCOUNTER — Ambulatory Visit: Payer: BC Managed Care – PPO | Admitting: Cardiology

## 2021-08-03 ENCOUNTER — Other Ambulatory Visit: Payer: Self-pay | Admitting: Family Medicine

## 2021-08-03 NOTE — Telephone Encounter (Signed)
Called patient to schedule appointment as he is overdue. He states he has been unable to take crestor and he would be calling back to schedule an appointment. Medication refused at this time.  Troy Jacobs, [place] 08/03/21 4:45 PM

## 2021-10-09 IMAGING — DX DG CHEST 2V
2 series · 2 of 2 positions shown · non-contrast
Comparison: March 11, 2019

CLINICAL DATA: LEFT kidney neoplasm

EXAM:
CHEST - 2 VIEW

[chest pa]
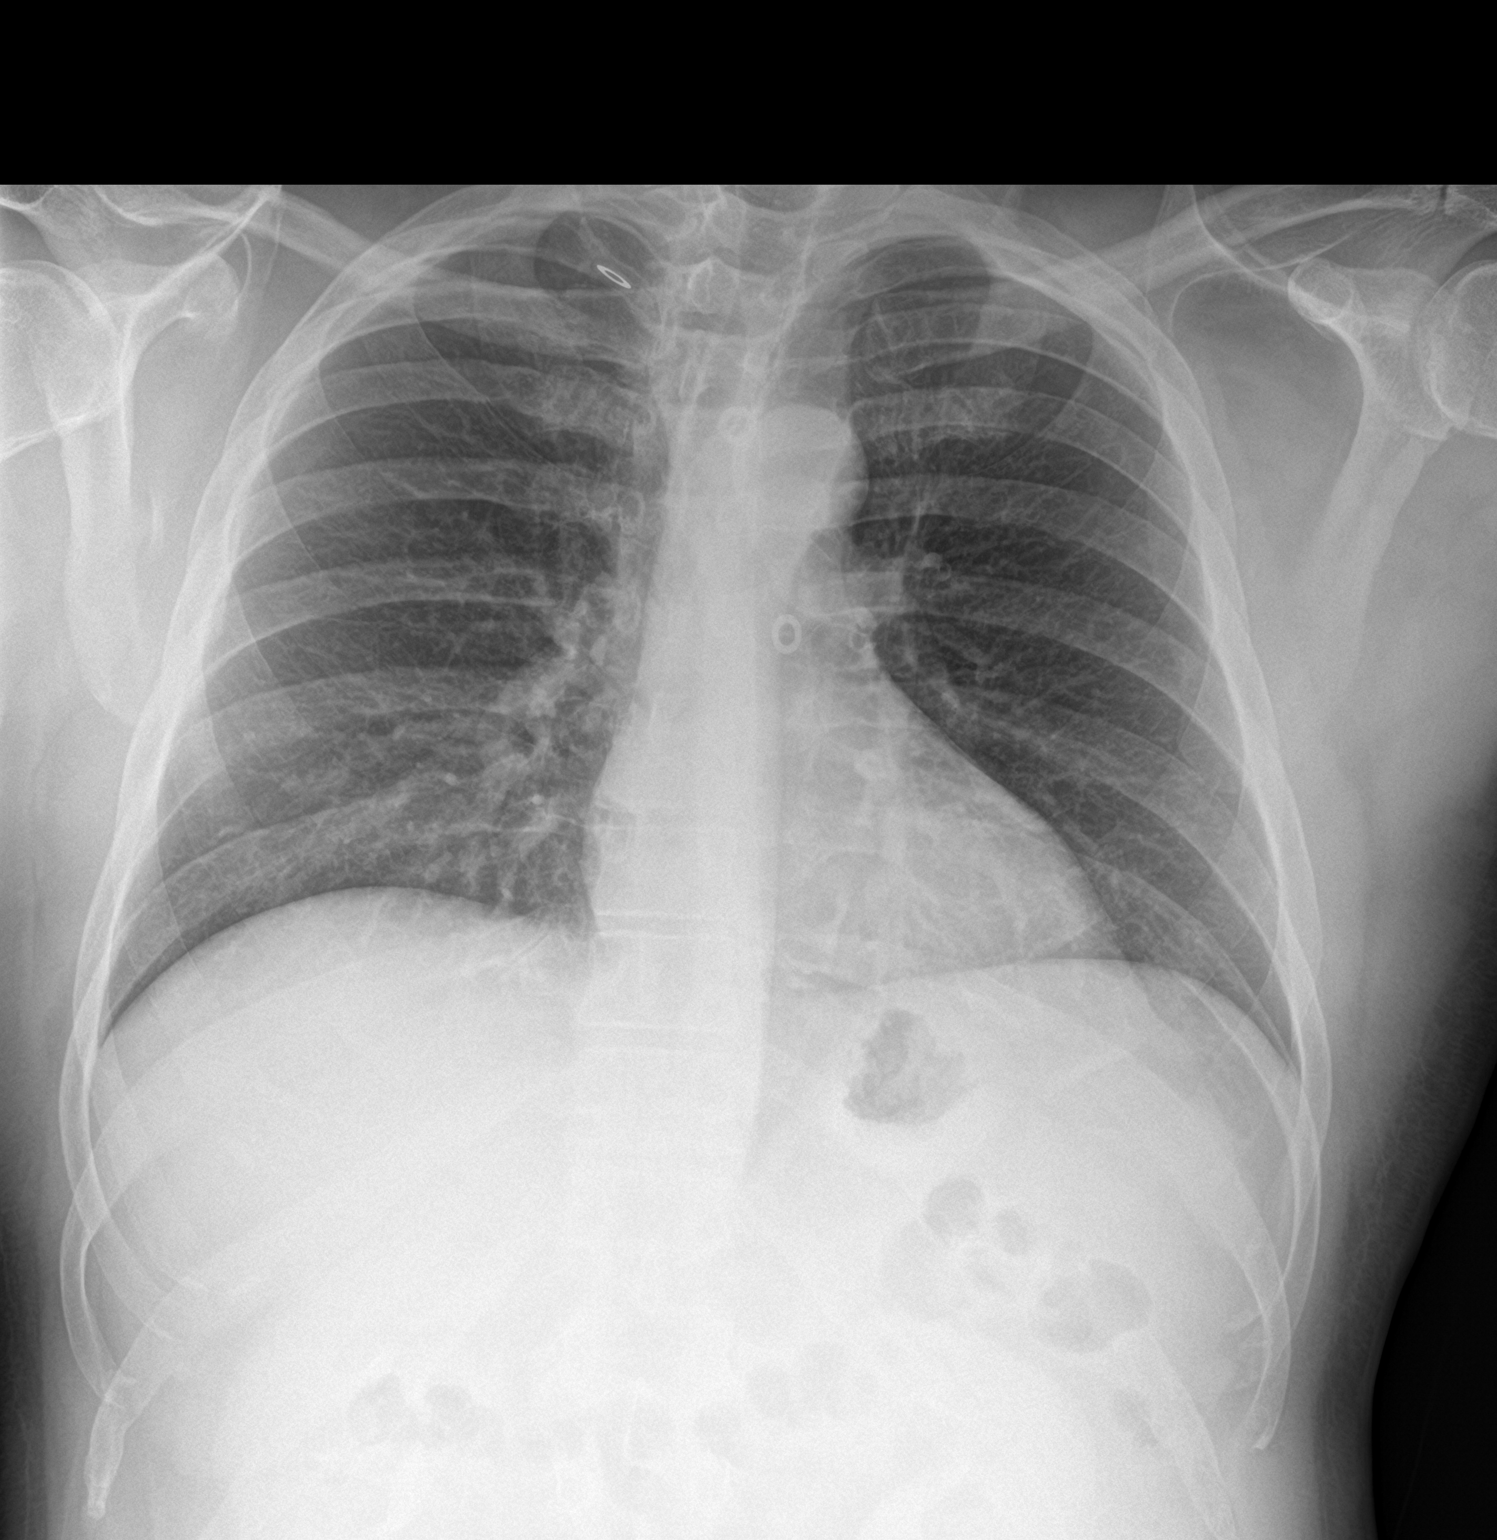

[chest lat]
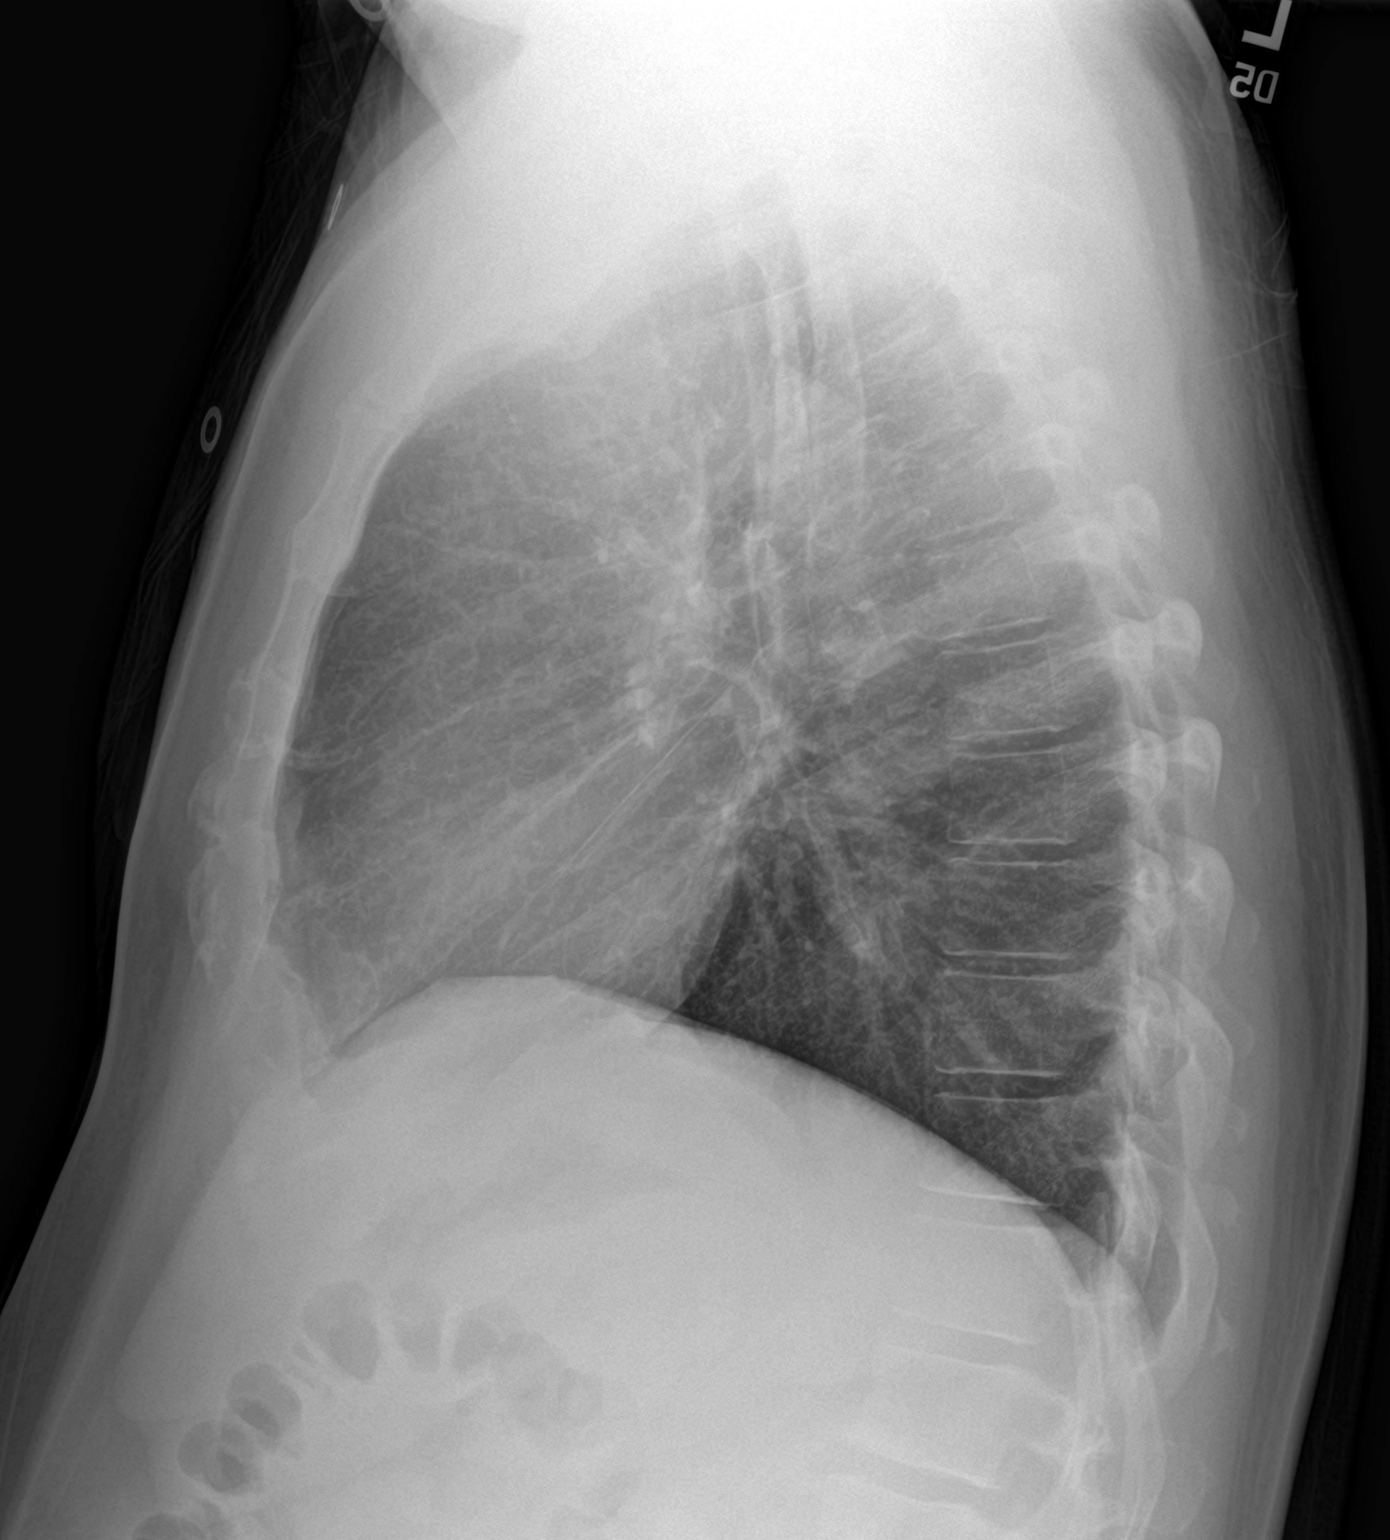

[2 of 2 positions shown; findings below may reference images not displayed]

FINDINGS: The cardiomediastinal silhouette is unchanged in contour. No pleural
effusion. No pneumothorax. No acute pleuroparenchymal abnormality.
Visualized abdomen is unremarkable. No acute osseous abnormality.
IMPRESSION: No acute cardiopulmonary abnormality.

## 2021-11-17 ENCOUNTER — Ambulatory Visit: Payer: BC Managed Care – PPO | Admitting: Family Medicine

## 2021-11-17 VITALS — BP 124/70 | HR 96 | Temp 97.4°F | Resp 18 | Ht 68.0 in | Wt 191.0 lb

## 2021-11-17 DIAGNOSIS — G4733 Obstructive sleep apnea (adult) (pediatric): Secondary | ICD-10-CM

## 2021-11-17 DIAGNOSIS — M79642 Pain in left hand: Secondary | ICD-10-CM

## 2021-11-17 DIAGNOSIS — E782 Mixed hyperlipidemia: Secondary | ICD-10-CM | POA: Diagnosis not present

## 2021-11-17 DIAGNOSIS — I10 Essential (primary) hypertension: Secondary | ICD-10-CM

## 2021-11-17 DIAGNOSIS — M79641 Pain in right hand: Secondary | ICD-10-CM

## 2021-11-17 DIAGNOSIS — I471 Supraventricular tachycardia, unspecified: Secondary | ICD-10-CM

## 2021-11-17 DIAGNOSIS — G720 Drug-induced myopathy: Secondary | ICD-10-CM

## 2021-11-17 DIAGNOSIS — Z1159 Encounter for screening for other viral diseases: Secondary | ICD-10-CM

## 2021-11-17 DIAGNOSIS — T466X5A Adverse effect of antihyperlipidemic and antiarteriosclerotic drugs, initial encounter: Secondary | ICD-10-CM

## 2021-11-17 NOTE — Progress Notes (Signed)
Subjective:  Patient ID: Troy Jacobs, male    DOB: [birth year]  Age: 51 y.o. MRN: [id number]  Chief Complaint  Patient presents with  . Hypertension  . Hyperlipidemia    HPI Patient saw Dr Troy Jacobs on 12/27/2020. Echocardiogram was normal. Did not have CTA of He is taking amlodipine-benazepril 10-40 mg everyday for Hypertension.  Hyperlipidemia: Did not tolerate lipitor (made him feel bad.) Rosuvastatin caused hives. Eating healthy. Not exercising due to working a lot of hours.   OSA: Wearing cpap and has benefited.    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   Family history of RA. Hands are stiff at times. Aching.    Current Outpatient Medications on File Prior to Visit  Medication Sig Dispense Refill  . amLODipine-benazepril (LOTREL) 10-40 MG capsule TAKE 1 CAPSULE BY MOUTH EVERY DAY 90 capsule 3   No current facility-administered medications on file prior to visit.   Past Medical History:  Diagnosis Date  . Apnea 09/25/2020  . Bilateral plantar fasciitis 01/24/2015  . 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 Hypertension   . 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 Palpitations 09/25/2020  . Renal mass 07/24/2017  . Umbilical hernia without obstruction and without gangrene 05/06/2017   Past Surgical History:  Procedure Laterality Date  . COLONOSCOPY  12/29/2012   normal  . ROBOTIC ASSITED PARTIAL NEPHRECTOMY Left 07/24/2017   Procedure: XI ROBOTIC ASSITED PARTIAL NEPHRECTOMY;  Surgeon: Troy Frock, MD;  Location: [facility];  Service: Urology;  Laterality: Left;  . WISDOM TOOTH EXTRACTION      Family History  Problem Relation Age of Onset  . Hypertension Father    Social History   Socioeconomic History  . Marital status: Married    Spouse name: Not on file  . Number of children: Not on file  . Years of education:  Not on file  . Highest education level: Not on file  Occupational History  . Occupation: Truck Education administrator: [employer]    Comment: [employer]  Tobacco Use  . Smoking status: Never    Passive exposure: Never  . Smokeless tobacco: Never  Vaping Use  . Vaping Use: Never used  Substance and Sexual Activity  . Alcohol use: Yes    Alcohol/week: 0.0 standard drinks of alcohol    Comment: occas.   . Drug use: No  . Sexual activity: Yes  Other Topics Concern  . Not on file  Social History Narrative  . Not on file   Social Determinants of Health   Financial Resource Strain: Not on file  Food Insecurity: Not on file  Transportation Needs: Not on file  Physical Activity: Not on file  Stress: Not on file  Social Connections: Not on file    Review of Systems  Constitutional:  Negative for chills, fatigue, fever and unexpected weight change.  HENT:  Negative for congestion, ear pain, sinus pain and sore throat.   Respiratory:  Negative for cough and shortness of breath.   Cardiovascular:  Negative for chest pain and palpitations.  Gastrointestinal:  Negative for abdominal pain, blood in stool, constipation, diarrhea, nausea and vomiting.  Endocrine: Negative for polydipsia.  Genitourinary:  Negative for dysuria.  Musculoskeletal:  Positive for arthralgias (hand pain). Negative for back pain.  Skin:  Negative for rash.  Neurological:  Negative for headaches.  Objective:  BP 124/70   Pulse 96   Temp (!) 97.4 F (36.3 C)   Resp 18   Ht '5\' 8"'$  (1.727 m)   Wt 191 lb (86.6 kg)   BMI 29.04 kg/m      11/17/2021   10:13 AM 02/02/2021    8:47 AM 12/27/2020    9:32 AM  BP/Weight  Systolic BP 924 268 341  Diastolic BP 70 80 78  Wt. (Lbs) 191 189 193.4  BMI 29.04 kg/m2 28.74 kg/m2 28.98 kg/m2    Physical Exam  Diabetic Foot Exam - Simple   No data filed      Lab Results  Component Value Date   WBC 4.6 02/02/2021   HGB 15.9 02/02/2021   HCT 46.3 02/02/2021   PLT 323  02/02/2021   GLUCOSE 107 (H) 02/02/2021   CHOL 181 02/02/2021   TRIG 57 02/02/2021   HDL 43 02/02/2021   LDLCALC 127 (H) 02/02/2021   ALT 44 02/02/2021   AST 20 02/02/2021   NA 142 02/02/2021   K 4.2 02/02/2021   CL 104 02/02/2021   CREATININE 1.00 02/02/2021   BUN 12 02/02/2021   CO2 26 02/02/2021   TSH 2.330 08/31/2020   HGBA1C 5.4 08/31/2020      Assessment & Plan:   Problem List Items Addressed This Visit     Hypertension - Primary   Mixed hyperlipidemia   PSVT (paroxysmal supraventricular tachycardia)   OSA (obstructive sleep apnea)  .  No orders of the defined types were placed in this encounter.   No orders of the defined types were placed in this encounter.    Follow-up: No follow-ups on file.  An After Visit Summary was printed and given to the patient.  Troy Brome, MD [facility] [phone]

## 2021-11-18 LAB — CBC WITH DIFFERENTIAL/PLATELET
Basophils Absolute: 0 10*3/uL (ref 0.0–0.2)
Basos: 1 %
EOS (ABSOLUTE): 0.1 10*3/uL (ref 0.0–0.4)
Eos: 2 %
Hematocrit: 44.4 % (ref 37.5–51.0)
Hemoglobin: 15.7 g/dL (ref 13.0–17.7)
Immature Grans (Abs): 0 10*3/uL (ref 0.0–0.1)
Immature Granulocytes: 0 %
Lymphocytes Absolute: 1.2 10*3/uL (ref 0.7–3.1)
Lymphs: 28 %
MCH: 31 pg (ref 26.6–33.0)
MCHC: 35.4 g/dL (ref 31.5–35.7)
MCV: 88 fL (ref 79–97)
Monocytes Absolute: 0.3 10*3/uL (ref 0.1–0.9)
Monocytes: 8 %
Neutrophils Absolute: 2.7 10*3/uL (ref 1.4–7.0)
Neutrophils: 61 %
Platelets: 259 10*3/uL (ref 150–450)
RBC: 5.07 x10E6/uL (ref 4.14–5.80)
RDW: 12.6 % (ref 11.6–15.4)
WBC: 4.4 10*3/uL (ref 3.4–10.8)

## 2021-11-18 LAB — LIPID PANEL
Chol/HDL Ratio: 3.3 ratio (ref 0.0–5.0)
Cholesterol, Total: 170 mg/dL (ref 100–199)
HDL: 51 mg/dL (ref >39)
LDL Chol Calc (NIH): 107 mg/dL — ABNORMAL HIGH (ref 0–99)
Triglycerides: 62 mg/dL (ref 0–149)
VLDL Cholesterol Cal: 12 mg/dL (ref 5–40)

## 2021-11-18 LAB — RHEUMATOID FACTOR: Rheumatoid fact SerPl-aCnc: <10 IU/mL (ref <14.0)

## 2021-11-18 LAB — COMPREHENSIVE METABOLIC PANEL
ALT: 42 IU/L (ref 0–44)
AST: 20 IU/L (ref 0–40)
Albumin/Globulin Ratio: 2.3 — ABNORMAL HIGH (ref 1.2–2.2)
Albumin: 4.8 g/dL (ref 3.8–4.9)
Alkaline Phosphatase: 75 IU/L (ref 44–121)
BUN/Creatinine Ratio: 10 (ref 9–20)
BUN: 10 mg/dL (ref 6–24)
Bilirubin Total: 0.7 mg/dL (ref 0.0–1.2)
CO2: 26 mmol/L (ref 20–29)
Calcium: 9.3 mg/dL (ref 8.7–10.2)
Chloride: 106 mmol/L (ref 96–106)
Creatinine, Ser: 0.98 mg/dL (ref 0.76–1.27)
Globulin, Total: 2.1 g/dL (ref 1.5–4.5)
Glucose: 95 mg/dL (ref 70–99)
Potassium: 4 mmol/L (ref 3.5–5.2)
Sodium: 144 mmol/L (ref 134–144)
Total Protein: 6.9 g/dL (ref 6.0–8.5)
eGFR: 93 mL/min/{1.73_m2} (ref >59)

## 2021-11-18 LAB — CARDIOVASCULAR RISK ASSESSMENT

## 2021-11-18 LAB — SEDIMENTATION RATE: Sed Rate: 4 mm/hr (ref 0–30)

## 2021-11-18 LAB — C-REACTIVE PROTEIN: CRP: <1 mg/L (ref 0–10)

## 2021-11-18 LAB — TSH: TSH: 2.24 u[IU]/mL (ref 0.450–4.500)

## 2021-11-18 LAB — HCV INTERPRETATION

## 2021-11-18 LAB — HCV AB W REFLEX TO QUANT PCR: HCV Ab: NONREACTIVE

## 2021-11-18 LAB — CYCLIC CITRUL PEPTIDE ANTIBODY, IGG/IGA: Cyclic Citrullin Peptide Ab: 2 units (ref 0–19)

## 2021-11-19 NOTE — Progress Notes (Signed)
Liver function normal.  Kidney function normal.  Cholesterol: LDL improved. Nearly at goal of less than 100. Patient stopped crestor because he did not tolerate it. Continue to work on low fat diet and exercise.  Hep C negative.  Arthritis panel is normal.

## 2021-11-20 ENCOUNTER — Encounter: Payer: Self-pay | Admitting: Family Medicine

## 2021-11-20 DIAGNOSIS — Z1159 Encounter for screening for other viral diseases: Secondary | ICD-10-CM | POA: Insufficient documentation

## 2021-11-20 DIAGNOSIS — G720 Drug-induced myopathy: Secondary | ICD-10-CM | POA: Insufficient documentation

## 2021-11-20 DIAGNOSIS — M79641 Pain in right hand: Secondary | ICD-10-CM | POA: Insufficient documentation

## 2021-11-20 DIAGNOSIS — T466X5A Adverse effect of antihyperlipidemic and antiarteriosclerotic drugs, initial encounter: Secondary | ICD-10-CM | POA: Insufficient documentation

## 2021-11-20 NOTE — Assessment & Plan Note (Signed)
Recommend continue to work on eating healthy diet and exercise. Not able to tolerate with statins.

## 2021-11-20 NOTE — Assessment & Plan Note (Signed)
Check arthritis panel

## 2021-11-20 NOTE — Assessment & Plan Note (Signed)
Improved with cpap.

## 2021-11-20 NOTE — Assessment & Plan Note (Signed)
Intolerant to 2 statins.

## 2021-11-20 NOTE — Assessment & Plan Note (Signed)
Continue to wear CPAP.

## 2021-11-20 NOTE — Assessment & Plan Note (Signed)
Well controlled.  No changes to medicines. Continue amlodipine-benazepril 10-40 mg daily Continue to work on eating a healthy diet and exercise.  Labs drawn today.

## 2021-11-23 ENCOUNTER — Other Ambulatory Visit: Payer: Self-pay | Admitting: Family Medicine

## 2021-11-23 DIAGNOSIS — I10 Essential (primary) hypertension: Secondary | ICD-10-CM

## 2022-11-06 NOTE — Progress Notes (Unsigned)
Subjective:  Patient ID: Troy Jacobs, male    DOB: [birth year]  Age: 52 y.o. MRN: [id number]  Chief Complaint  Patient presents with   Annual Exam    HPI  Well Adult Physical: Patient here for a comprehensive physical exam.The patient reports {problems:16946} Do you take any herbs or supplements that were not prescribed by a doctor? {yes/no/not asked:9010} Are you taking calcium supplements? {yes/no:63} Are you taking aspirin daily? {yes/no:63}  Encounter for general adult medical examination without abnormal findings  Physical ("At Risk" items are starred): Patient's last physical exam was 1 year ago .  Patient wears a seat belt, has smoke detectors, has carbon monoxide detectors, practices appropriate gun safety, and wears sunscreen with extended sun exposure. Dental Care: biannual cleanings, brushes and flosses daily. Ophthalmology/Optometry: Annual visit.  Hearing loss: none Vision impairments: none Last PSA:     08/31/2020    8:34 AM 09/09/2019   12:18 AM  Depression screen PHQ 2/9  Decreased Interest 0 0  Down, Depressed, Hopeless 0 0  PHQ - 2 Score 0 0         08/31/2020    8:34 AM  Fall Risk  Falls in the past year? 0  Was there an injury with Fall? 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   Palpitations 09/25/2020   Renal mass 07/24/2017   Umbilical hernia without obstruction and without gangrene 05/06/2017   Past Surgical History:  Procedure Laterality Date   COLONOSCOPY  12/29/2012   normal   ROBOTIC ASSITED PARTIAL  NEPHRECTOMY Left 07/24/2017   Procedure: XI ROBOTIC ASSITED PARTIAL NEPHRECTOMY;  Surgeon: Sebastian Ache, MD;  Location: [facility];  Service: Urology;  Laterality: Left;   WISDOM TOOTH EXTRACTION      Family History  Problem Relation Age of Onset   Hypertension Father    Social History   Socioeconomic History   Marital status: Married    Spouse name: Not on file   Number of children: Not on file   Years of education: Not on file   Highest education level: Not on file  Occupational History   Occupation: Truck Air traffic controller: [employer]    Comment: [employer]  Tobacco Use   Smoking status: Never    Passive exposure: Never   Smokeless tobacco: Never  Vaping Use   Vaping status: Never Used  Substance and Sexual Activity   Alcohol use: Yes    Alcohol/week: 0.0 standard drinks of alcohol    Comment: occas.    Drug use: No   Sexual activity: Yes  Other Topics Concern   Not on file  Social History Narrative   Not on file   Social Determinants of Health   Financial Resource Strain: Not on file  Food Insecurity: Not on file  Transportation Needs: Not on file  Physical Activity: Not on file  Stress: Not on file  Social Connections: Unknown (05/15/2021)   [facility] from [facility]   Social Network    Social Network: Not on file   Review of Systems   Objective:  There were no vitals taken for this visit.     11/17/2021   10:13 AM 02/02/2021    8:47 AM 12/27/2020    9:32 AM  BP/Weight  Systolic BP 124 120 120  Diastolic BP 70 80 78  Wt. (Lbs) 191 189 193.4  BMI 29.04 kg/m2 28.74 kg/m2 28.98 kg/m2    Physical Exam  Lab Results  Component Value Date   WBC 4.4 11/17/2021   HGB 15.7 11/17/2021   HCT 44.4 11/17/2021   PLT 259 11/17/2021   GLUCOSE 95 11/17/2021   CHOL 170 11/17/2021   TRIG 62 11/17/2021   HDL 51 11/17/2021   LDLCALC 107 (H) 11/17/2021   ALT 42 11/17/2021   AST 20 11/17/2021   NA 144 11/17/2021   K 4.0 11/17/2021   CL 106  11/17/2021   CREATININE 0.98 11/17/2021   BUN 10 11/17/2021   CO2 26 11/17/2021   TSH 2.240 11/17/2021   HGBA1C 5.4 08/31/2020      Assessment & Plan:  There are no diagnoses linked to this encounter.   There is no height or weight on file to calculate BMI.   These are the goals we discussed:  Goals   None      This is a list of the screening recommended for you and due dates:  Health Maintenance  Topic Date Due   HIV Screening  Never done   DTaP/Tdap/Td vaccine (1 - Tdap) Never done   Zoster (Shingles) Vaccine (1 of 2) Never done   Flu Shot  Never done   Colon Cancer Screening  12/30/2022   Hepatitis C Screening  Completed   HPV Vaccine  Aged Out   COVID-19 Vaccine  Discontinued     No orders of the defined types were placed in this encounter.    Follow-up: No follow-ups on file.  An After Visit Summary was printed and given to the patient.  Langley Gauss, [facility] [phone]

## 2022-11-07 ENCOUNTER — Encounter: Payer: Self-pay | Admitting: Family Medicine

## 2022-11-07 ENCOUNTER — Ambulatory Visit (INDEPENDENT_AMBULATORY_CARE_PROVIDER_SITE_OTHER): Payer: BC Managed Care – PPO | Admitting: Family Medicine

## 2022-11-07 VITALS — BP 102/70 | HR 73 | Temp 98.3°F | Resp 16 | Ht 68.0 in | Wt 198.8 lb

## 2022-11-07 DIAGNOSIS — Z23 Encounter for immunization: Secondary | ICD-10-CM | POA: Insufficient documentation

## 2022-11-07 DIAGNOSIS — I10 Essential (primary) hypertension: Secondary | ICD-10-CM

## 2022-11-07 DIAGNOSIS — Z0000 Encounter for general adult medical examination without abnormal findings: Secondary | ICD-10-CM | POA: Diagnosis not present

## 2022-11-07 MED ORDER — AMLODIPINE BESY-BENAZEPRIL HCL 10-20 MG PO CAPS: 1.0000 | ORAL_CAPSULE | Freq: Every day | ORAL | 4 refills | Status: DC

## 2022-11-07 MED ORDER — AMLODIPINE BESY-BENAZEPRIL HCL 10-40 MG PO CAPS: ORAL_CAPSULE | ORAL | 3 refills | Status: DC

## 2022-11-07 MED ORDER — AMLODIPINE BESY-BENAZEPRIL HCL 10-40 MG PO CAPS: 1.0000 | ORAL_CAPSULE | Freq: Every day | ORAL | 3 refills | Status: DC

## 2022-11-07 NOTE — Assessment & Plan Note (Signed)
Well controlled.  No changes to medicines. Continue amlodipine-benazepril 10-40 mg daily Continue to work on eating a healthy diet and exercise.  Labs drawn today.   

## 2022-11-07 NOTE — Patient Instructions (Signed)
Aim to do some physical activity for 150 minutes per week. This is typically divided into 5 days per week, 30 minutes per day. The activity should be enough to get your heart rate up. Anything is better than nothing if you have time constraints.    Mediterranean Diet A Mediterranean diet is based on the traditions of countries on the [employer]. It focuses on eating more: Fruits and vegetables. Whole grains, beans, nuts, and seeds. Heart-healthy fats. These are fats that are good for your heart. It involves eating less: Dairy. Meat and eggs. Processed foods with added sugar, salt, and fat. This type of diet can help prevent certain conditions. It can also improve outcomes if you have a long-term (chronic) disease, such as kidney or heart disease. What are tips for following this plan? Reading food labels Check packaged foods for: The serving size. For foods such as rice and pasta, the serving size is the amount of cooked product, not dry. The total fat. Avoid foods with saturated fat or trans fat. Added sugars, such as corn syrup. Shopping  Try to have a balanced diet. Buy a variety of foods, such as: Fresh fruits and vegetables. You may be able to get these from local farmers markets. You can also buy them frozen. Grains, beans, nuts, and seeds. Some of these can be bought in bulk. Fresh seafood. Poultry and eggs. Low-fat dairy products. Buy whole ingredients instead of foods that have already been packaged. If you can't get fresh seafood, buy precooked frozen shrimp or canned fish, such as tuna, salmon, or sardines. Stock your pantry so you always have certain foods on hand, such as olive oil, canned tuna, canned tomatoes, rice, pasta, and beans. Cooking Cook foods with extra-virgin olive oil instead of using butter or other vegetable oils. Have meat as a side dish. Have vegetables or grains as your main dish. This means having meat in small portions or adding small amounts of  meat to foods like pasta or stew. Use beans or vegetables instead of meat in common dishes like chili or lasagna. Try out different cooking methods. Try roasting, broiling, steaming, and sauting vegetables. Add frozen vegetables to soups, stews, pasta, or rice. Add nuts or seeds for added healthy fats and plant protein at each meal. You can add these to yogurt, salads, or vegetable dishes. Marinate fish or vegetables using olive oil, lemon juice, garlic, and fresh herbs. Meal planning Plan to eat a vegetarian meal one day each week. Try to work up to two vegetarian meals, if possible. Eat seafood two or more times a week. Have healthy snacks on hand. These may include: Vegetable sticks with hummus. Greek yogurt. Fruit and nut trail mix. Eat balanced meals. These should include: Fruit: 2-3 servings a day. Vegetables: 4-5 servings a day. Low-fat dairy: 2 servings a day. Fish, poultry, or lean meat: 1 serving a day. Beans and legumes: 2 or more servings a week. Nuts and seeds: 1-2 servings a day. Whole grains: 6-8 servings a day. Extra-virgin olive oil: 3-4 servings a day. Limit red meat and sweets to just a few servings a month. Lifestyle  Try to cook and eat meals with your family. Drink enough fluid to keep your pee (urine) pale yellow. Be active every day. This includes: Aerobic exercise, which is exercise that causes your heart to beat faster. Examples include running and swimming. Leisure activities like gardening, walking, or housework. Get 7-8 hours of sleep each night. Drink red wine if your provider  says you can. A glass of wine is 5 oz (150 mL). You may be allowed to have: Up to 1 glass a day if you're male and not pregnant. Up to 2 glasses a day if you're male. What foods should I eat? Fruits Apples. Apricots. Avocado. Berries. Bananas. Cherries. Dates. Figs. Grapes. Lemons. Melon. Oranges. Peaches. Plums. Pomegranate. Vegetables Artichokes. Beets. Broccoli. Cabbage.  Carrots. Eggplant. Green beans. Chard. Kale. Spinach. Onions. Leeks. Peas. Squash. Tomatoes. Peppers. Radishes. Grains Whole-grain pasta. Brown rice. Bulgur wheat. Polenta. Couscous. Whole-wheat bread. Orpah Cobb. Meats and other proteins Beans. Almonds. Sunflower seeds. Pine nuts. Peanuts. Cod. Salmon. Scallops. Shrimp. Tuna. Tilapia. Clams. Oysters. Eggs. Chicken or [place] without skin. Dairy Low-fat milk. Cheese. Greek yogurt. Fats and oils Extra-virgin olive oil. Avocado oil. Grapeseed oil. Beverages Water. Red wine. Herbal tea. Sweets and desserts Greek yogurt with honey. Baked apples. Poached pears. Trail mix. Seasonings and condiments Basil. Cilantro. Coriander. Cumin. Mint. Parsley. Sage. Rosemary. Tarragon. Garlic. Oregano. Thyme. Pepper. Balsamic vinegar. Tahini. Hummus. Tomato sauce. Olives. Mushrooms. The items listed above may not be all the foods and drinks you can have. Talk to a dietitian to learn more. What foods should I limit? This is a list of foods that should be eaten rarely. Fruits Fruit canned in syrup. Vegetables Deep-fried potatoes, like [place] fries. Grains Packaged pasta or rice dishes. Cereal with added sugar. Snacks with added sugar. Meats and other proteins Beef. Pork. Lamb. Chicken or [place] with skin. Hot dogs. Tomasa Blase. Dairy Ice cream. Sour cream. Whole milk. Fats and oils Butter. Canola oil. Vegetable oil. Beef fat (tallow). Lard. Beverages Juice. Sugar-sweetened soft drinks. Beer. Liquor and spirits. Sweets and desserts Cookies. Cakes. Pies. Candy. Seasonings and condiments Mayonnaise. Pre-made sauces and marinades. The items listed above may not be all the foods and drinks you should limit. Talk to a dietitian to learn more. Where to find more information American Heart Association (AHA): heart.org This information is not intended to replace advice given to you by your health care provider. Make sure you discuss any questions you have  with your health care provider. Document Revised: 04/01/2022 Document Reviewed: 04/01/2022 Elsevier Patient Education  2024 [employer].

## 2022-11-08 ENCOUNTER — Other Ambulatory Visit: Payer: Self-pay | Admitting: Family Medicine

## 2022-11-08 DIAGNOSIS — R7301 Impaired fasting glucose: Secondary | ICD-10-CM

## 2022-11-08 LAB — CBC WITH DIFFERENTIAL/PLATELET
Basophils Absolute: 0 10*3/uL (ref 0.0–0.2)
Basos: 1 %
EOS (ABSOLUTE): 0.2 10*3/uL (ref 0.0–0.4)
Eos: 4 %
Hematocrit: 46.5 % (ref 37.5–51.0)
Hemoglobin: 15.3 g/dL (ref 13.0–17.7)
Immature Grans (Abs): 0 10*3/uL (ref 0.0–0.1)
Immature Granulocytes: 1 %
Lymphocytes Absolute: 1.1 10*3/uL (ref 0.7–3.1)
Lymphs: 26 %
MCH: 29.9 pg (ref 26.6–33.0)
MCHC: 32.9 g/dL (ref 31.5–35.7)
MCV: 91 fL (ref 79–97)
Monocytes Absolute: 0.4 10*3/uL (ref 0.1–0.9)
Monocytes: 8 %
Neutrophils Absolute: 2.6 10*3/uL (ref 1.4–7.0)
Neutrophils: 60 %
Platelets: 279 10*3/uL (ref 150–450)
RBC: 5.12 x10E6/uL (ref 4.14–5.80)
RDW: 13 % (ref 11.6–15.4)
WBC: 4.3 10*3/uL (ref 3.4–10.8)

## 2022-11-08 LAB — COMPREHENSIVE METABOLIC PANEL
ALT: 45 [IU]/L — ABNORMAL HIGH (ref 0–44)
AST: 18 [IU]/L (ref 0–40)
Albumin: 4.9 g/dL (ref 3.8–4.9)
Alkaline Phosphatase: 79 [IU]/L (ref 44–121)
BUN/Creatinine Ratio: 15 (ref 9–20)
BUN: 14 mg/dL (ref 6–24)
Bilirubin Total: 0.7 mg/dL (ref 0.0–1.2)
CO2: 26 mmol/L (ref 20–29)
Calcium: 9.6 mg/dL (ref 8.7–10.2)
Chloride: 102 mmol/L (ref 96–106)
Creatinine, Ser: 0.94 mg/dL (ref 0.76–1.27)
Globulin, Total: 2.2 g/dL (ref 1.5–4.5)
Glucose: 111 mg/dL — ABNORMAL HIGH (ref 70–99)
Potassium: 4.7 mmol/L (ref 3.5–5.2)
Sodium: 142 mmol/L (ref 134–144)
Total Protein: 7.1 g/dL (ref 6.0–8.5)
eGFR: 97 mL/min/{1.73_m2} (ref >59)

## 2022-11-08 LAB — LIPID PANEL
Chol/HDL Ratio: 3.5 ratio (ref 0.0–5.0)
Cholesterol, Total: 187 mg/dL (ref 100–199)
HDL: 53 mg/dL (ref >39)
LDL Chol Calc (NIH): 122 mg/dL — ABNORMAL HIGH (ref 0–99)
Triglycerides: 63 mg/dL (ref 0–149)
VLDL Cholesterol Cal: 12 mg/dL (ref 5–40)

## 2022-11-08 LAB — T4, FREE: Free T4: 1.3 ng/dL (ref 0.82–1.77)

## 2022-11-08 LAB — TSH: TSH: 2.69 u[IU]/mL (ref 0.450–4.500)

## 2022-11-08 LAB — HEMOGLOBIN A1C
Est. average glucose Bld gHb Est-mCnc: 117 mg/dL
Hgb A1c MFr Bld: 5.7 % — ABNORMAL HIGH (ref 4.8–5.6)

## 2022-11-08 LAB — SPECIMEN STATUS REPORT

## 2023-02-26 ENCOUNTER — Encounter: Payer: Self-pay | Admitting: Gastroenterology

## 2023-10-01 LAB — HM COLONOSCOPY

## 2023-11-13 ENCOUNTER — Telehealth: Payer: Self-pay

## 2023-11-13 NOTE — Telephone Encounter (Signed)
 Called and made appointment  Copied from CRM [id number]. Topic: Appointments - Appointment Scheduling >> Nov 13, 2023 10:57 AM Troy Jacobs wrote: Patient/patient representative is calling to schedule an appointment. Refer to attachments for appointment information.  Troy Jacobs would like to schedule his annual physical with Dr. Milon and it shows he is not accepting new patients. He was thinking the clinic transferred him to Dr. Milon. Please call Troy Jacobs at [phone] to discuss.

## 2024-01-06 NOTE — Progress Notes (Signed)
 "  Subjective:  Patient ID: Troy Jacobs, male    DOB: 10-28-1970  Age: 54 y.o. MRN: [id number]  Chief Complaint  Patient presents with   Annual Exam   HPI:  Discussed the use of AI scribe software for clinical note transcription with the patient, who gave verbal consent to proceed.  History of Present Illness Troy Jacobs is a 54 year old male who presents for an annual physical exam and medication refill.  He is here to renew his blood pressure medication prescription, which is due for renewal. He typically receives a 90-day supply with three refills, covering a year. He has a history of hypertension, which has been stable on his current medication regimen of amlodipine  and benazepril  (Lotrel), taken once daily. No recent high blood pressure readings at home and his blood pressure does not fluctuate much.  He has been using a CPAP machine for sleep apnea for about two years. Initially, it was effective, but he is uncertain about its current effectiveness. His previous sleep doctor retired, and he is seeking a referral to a new sleep specialist closer to his location.  He has a history of kidney cancer diagnosed in 2019, treated with surgery to remove a small tumor from the bottom of his kidney. He was under surveillance for five years until 2024 and has not had any follow-up since then. The cancer was caught early and did not require removal of the entire kidney.  In 2024, his A1c level was 5.7, placing him in the prediabetes category. He has not been started on any medication for this.  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 The patient reports no urinary symptoms such as frequency, straining, or incomplete emptying. The patient reports no blood or mucus in his stool.    Well Adult Physical: Patient here for a comprehensive physical exam.The patient reports problems - needs referral for  sleep study Do you take any herbs or supplements that were not prescribed by a doctor? no Are you taking calcium  supplements? no Are you taking aspirin daily? no  Encounter for general adult medical examination without abnormal findings  Physical (At Risk items are starred): Patient's last physical exam was 1 year ago .  Patient wears a seat belt, has smoke detectors, has carbon monoxide detectors, practices appropriate gun safety, and wears sunscreen with extended sun exposure. Dental Care: biannual cleanings, brushes and flosses daily. Ophthalmology/Optometry: Annual visit.  Hearing loss: none Vision impairments: none Last PSA:     11/07/2022    8:00 AM 08/31/2020    8:34 AM 09/09/2019   12:18 AM  Depression screen PHQ 2/9  Decreased Interest 0 0 0  Down, Depressed, Hopeless 0 0 0  PHQ - 2 Score 0 0 0  Altered sleeping 0    Tired, decreased energy 0    Change in appetite 0    Feeling bad or failure about yourself  0    Trouble concentrating 0    Moving slowly or fidgety/restless 0    Suicidal thoughts 0    PHQ-9 Score 0     Difficult doing work/chores Not difficult at all       Data saved with a previous flowsheet row definition         08/31/2020    8:34 AM 11/07/2022    8:00 AM  Fall Risk  Falls in the past year? 0 0  Was there  an injury with Fall? 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   Palpitations 09/25/2020    Renal mass 07/24/2017   Umbilical hernia without obstruction and without gangrene 05/06/2017   Past Surgical History:  Procedure Laterality Date   COLONOSCOPY  12/29/2012   normal   ROBOTIC ASSITED PARTIAL NEPHRECTOMY Left 07/24/2017   Procedure: XI ROBOTIC ASSITED PARTIAL NEPHRECTOMY;  Surgeon: Alvaro Hummer, MD;  Location: [facility];  Service: Urology;  Laterality: Left;   WISDOM TOOTH EXTRACTION      Family History  Problem Relation Age of Onset   Hypertension Father    Social History   Socioeconomic History   Marital status: Married    Spouse name: Not on file   Number of children: Not on file   Years of education: Not on file   Highest education level: Not on file  Occupational History   Occupation: Truck Air Traffic Controller: [employer]    Comment: [employer]  Tobacco Use   Smoking status: Never    Passive exposure: Never   Smokeless tobacco: Never  Vaping Use   Vaping status: Never Used  Substance and Sexual Activity   Alcohol  use: Yes    Alcohol /week: 0.0 standard drinks of alcohol     Comment: occas.    Drug use: No   Sexual activity: Yes  Other Topics Concern   Not on file  Social History Narrative   Not on file   Social Drivers of Health   Tobacco Use: Low Risk (01/07/2024)   Patient History    Smoking Tobacco Use: Never    Smokeless Tobacco Use: Never    Passive Exposure: Never  Financial Resource Strain: Not on file  Food Insecurity: Not on file  Transportation Needs: Not on file  Physical Activity: Not on file  Stress: Not on file  Social Connections: Unknown (05/15/2021)   [facility]   Social Network    Social Network: Not on file  Depression (PHQ2-9): Low Risk (11/07/2022)   Depression (PHQ2-9)    PHQ-2 Score: 0  Alcohol  Screen: Not on file  Housing: Not on file  Utilities: Not on file  Health Literacy: Not on file   Review of Systems  Constitutional:  Negative for appetite change, fatigue and fever.  HENT:  Negative for congestion, ear  pain, sinus pressure and sore throat.   Eyes: Negative.   Respiratory:  Negative for cough, chest tightness, shortness of breath and wheezing.   Cardiovascular:  Negative for chest pain and palpitations.  Gastrointestinal:  Negative for abdominal pain, constipation, diarrhea, nausea and vomiting.  Endocrine: Negative.   Genitourinary:  Negative for dysuria and hematuria.  Musculoskeletal:  Negative for arthralgias, back pain, joint swelling and myalgias.  Skin:  Negative for rash.  Allergic/Immunologic: Negative.   Neurological:  Negative for dizziness, weakness, light-headedness and headaches.  Psychiatric/Behavioral:  Negative for dysphoric mood. The patient is not nervous/anxious.      Objective:  BP 136/70   Pulse 72   Temp 98.5 F (36.9 C) (Temporal)   Resp 18   Ht 5' 8 (1.727 m)   Wt 204 lb 8 oz (92.8 kg)   SpO2 98%   BMI 31.09 kg/m      01/07/2024    7:42 AM 11/07/2022    7:56 AM 11/17/2021   10:13 AM  BP/Weight  Systolic BP 136 102 124  Diastolic BP 70 70 70  Wt. (Lbs) 204.5 198.8 191  BMI 31.09 kg/m2 30.23 kg/m2 29.04 kg/m2    Physical Exam Constitutional:      Appearance: Normal appearance.  HENT:     Right Ear: Tympanic membrane normal.     Left Ear: Tympanic membrane normal.     Nose: Nose normal.     Mouth/Throat:     Pharynx: No oropharyngeal exudate or posterior oropharyngeal erythema.  Eyes:     Conjunctiva/sclera: Conjunctivae normal.  Neck:     Vascular: No carotid bruit.  Cardiovascular:     Rate and Rhythm: Normal rate and regular rhythm.     Heart sounds: Normal heart sounds.  Pulmonary:     Effort: Pulmonary effort is normal.     Breath sounds: Normal breath sounds.  Abdominal:     General: Bowel sounds are normal.     Palpations: Abdomen is soft.     Tenderness: There is no abdominal tenderness.  Skin:    Findings: No lesion or rash.  Neurological:     Mental Status: He is alert and oriented to person, place, and time.   Psychiatric:        Behavior: Behavior normal.     Lab Results  Component Value Date   WBC 4.3 11/07/2022   HGB 15.3 11/07/2022   HCT 46.5 11/07/2022   PLT 279 11/07/2022   GLUCOSE 111 (H) 11/07/2022   CHOL 187 11/07/2022   TRIG 63 11/07/2022   HDL 53 11/07/2022   LDLCALC 122 (H) 11/07/2022   ALT 45 (H) 11/07/2022   AST 18 11/07/2022   NA 142 11/07/2022   K 4.7 11/07/2022   CL 102 11/07/2022   CREATININE 0.94 11/07/2022   BUN 14 11/07/2022   CO2 26 11/07/2022   TSH 2.690 11/07/2022   HGBA1C 5.7 (H) 11/07/2022      Assessment & Plan:  Essential hypertension, benign   Assessment and Plan Assessment & Plan Essential hypertension Blood pressure controlled with amlodipine  and benazepril . - Continue amlodipine  and benazepril  once daily. - Sent prescription to [facility] with four refills for a year.  Obstructive sleep apnea Managed with CPAP therapy. New referral needed due to previous CPAP doctor's retirement. - Referred to [facility]. - Instructed to follow up if no contact from pulmonology within two weeks.  Mixed hyperlipidemia Previous borderline liver function tests. Recent dietary indulgence may affect lipid levels. - Ordered blood tests to assess cholesterol levels and liver function.  Prediabetes Previous A1c of 5.7%. Emphasis on lifestyle modifications. - Ordered A1c test to monitor glucose levels. - Encouraged diet and exercise modifications.  History of kidney cancer Kidney cancer treated in 2019 with surgical removal. Cleared after five years of surveillance.  General Health Maintenance Routine health maintenance discussed. - Ordered basic blood panel including blood count, kidney and liver function, A1c, cholesterol, and thyroid function tests. - Plan  for PSA screening next year when insurance covers it.  Recording duration: 19 minutes    Body mass index is 31.09 kg/m.   These are the goals we discussed:  Goals       Activity and Exercise Increased     Evidence-based guidance:  Review current exercise levels.  Assess patient perspective on exercise or activity level, barriers to increasing activity, motivation and readiness for change.  Recommend or set healthy exercise goal based on individual tolerance.  Encourage small steps toward making change in amount of exercise or activity.  Urge reduction of sedentary activities or screen time.  Promote group activities within the community or with family or support person.  Consider referral to rehabiliation therapist for assessment and exercise/activity plan.   Notes:  Patient is excited to retire so he can exercise more. Currently he does try to exercise on the treadmill as much as he can when he isn't working.         This is a list of the screening recommended for you and due dates:  Health Maintenance  Topic Date Due   HIV Screening  Never done   Hepatitis B Vaccine (1 of 3 - 19+ 3-dose series) Never done   Zoster (Shingles) Vaccine (1 of 2) Never done   Pneumococcal Vaccine for age over 64 (1 of 1 - PCV) Never done   Flu Shot  Never done   Colon Cancer Screening  10/01/2030   DTaP/Tdap/Td vaccine (2 - Td or Tdap) 11/06/2032   Hepatitis C Screening  Completed   HPV Vaccine  Aged Out   Meningitis B Vaccine  Aged Out   COVID-19 Vaccine  Discontinued     No orders of the defined types were placed in this encounter.    Follow-up: No follow-ups on file.  An After Visit Summary was printed and given to the patient.  Nola Angles, [place] [facility] [phone]  "

## 2024-01-07 ENCOUNTER — Encounter: Payer: Self-pay | Admitting: Physician Assistant

## 2024-01-07 ENCOUNTER — Ambulatory Visit (INDEPENDENT_AMBULATORY_CARE_PROVIDER_SITE_OTHER): Admitting: Physician Assistant

## 2024-01-07 VITALS — BP 136/70 | HR 72 | Temp 98.5°F | Resp 18 | Ht 68.0 in | Wt 204.5 lb

## 2024-01-07 DIAGNOSIS — I10 Essential (primary) hypertension: Secondary | ICD-10-CM

## 2024-01-07 DIAGNOSIS — R7303 Prediabetes: Secondary | ICD-10-CM

## 2024-01-07 DIAGNOSIS — G4733 Obstructive sleep apnea (adult) (pediatric): Secondary | ICD-10-CM | POA: Diagnosis not present

## 2024-01-07 DIAGNOSIS — E782 Mixed hyperlipidemia: Secondary | ICD-10-CM | POA: Diagnosis not present

## 2024-01-07 LAB — TSH: TSH: 3.61 u[IU]/mL (ref 0.450–4.500)

## 2024-01-07 LAB — CBC WITH DIFFERENTIAL/PLATELET
Basophils Absolute: 0 x10E3/uL (ref 0.0–0.2)
Basos: 0 %
EOS (ABSOLUTE): 0.1 x10E3/uL (ref 0.0–0.4)
Eos: 3 %
Hematocrit: 46.6 % (ref 37.5–51.0)
Hemoglobin: 15.5 g/dL (ref 13.0–17.7)
Immature Grans (Abs): 0 x10E3/uL (ref 0.0–0.1)
Immature Granulocytes: 0 %
Lymphocytes Absolute: 1.1 x10E3/uL (ref 0.7–3.1)
Lymphs: 23 %
MCH: 29.8 pg (ref 26.6–33.0)
MCHC: 33.3 g/dL (ref 31.5–35.7)
MCV: 90 fL (ref 79–97)
Monocytes Absolute: 0.3 x10E3/uL (ref 0.1–0.9)
Monocytes: 7 %
Neutrophils Absolute: 3.3 x10E3/uL (ref 1.4–7.0)
Neutrophils: 67 %
Platelets: 294 x10E3/uL (ref 150–450)
RBC: 5.2 x10E6/uL (ref 4.14–5.80)
RDW: 13 % (ref 11.6–15.4)
WBC: 4.9 x10E3/uL (ref 3.4–10.8)

## 2024-01-07 LAB — LIPID PANEL
Chol/HDL Ratio: 3.5 ratio (ref 0.0–5.0)
Cholesterol, Total: 200 mg/dL — ABNORMAL HIGH (ref 100–199)
HDL: 57 mg/dL
LDL Chol Calc (NIH): 132 mg/dL — ABNORMAL HIGH (ref 0–99)
Triglycerides: 60 mg/dL (ref 0–149)
VLDL Cholesterol Cal: 11 mg/dL (ref 5–40)

## 2024-01-07 LAB — COMPREHENSIVE METABOLIC PANEL WITH GFR
ALT: 59 IU/L — ABNORMAL HIGH (ref 0–44)
AST: 25 IU/L (ref 0–40)
Albumin: 4.8 g/dL (ref 3.8–4.9)
Alkaline Phosphatase: 90 IU/L (ref 47–123)
BUN/Creatinine Ratio: 12 (ref 9–20)
BUN: 12 mg/dL (ref 6–24)
Bilirubin Total: 0.6 mg/dL (ref 0.0–1.2)
CO2: 24 mmol/L (ref 20–29)
Calcium: 9.6 mg/dL (ref 8.7–10.2)
Chloride: 104 mmol/L (ref 96–106)
Creatinine, Ser: 1.02 mg/dL (ref 0.76–1.27)
Globulin, Total: 2.4 g/dL (ref 1.5–4.5)
Glucose: 108 mg/dL — ABNORMAL HIGH (ref 70–99)
Potassium: 4.4 mmol/L (ref 3.5–5.2)
Sodium: 142 mmol/L (ref 134–144)
Total Protein: 7.2 g/dL (ref 6.0–8.5)
eGFR: 87 mL/min/1.73

## 2024-01-07 LAB — HEMOGLOBIN A1C
Est. average glucose Bld gHb Est-mCnc: 111 mg/dL
Hgb A1c MFr Bld: 5.5 % (ref 4.8–5.6)

## 2024-01-07 LAB — T4, FREE: Free T4: 1.26 ng/dL (ref 0.82–1.77)

## 2024-01-07 MED ORDER — AMLODIPINE BESY-BENAZEPRIL HCL 10-40 MG PO CAPS: 1.0000 | ORAL_CAPSULE | Freq: Every day | ORAL | 3 refills | Status: AC

## 2024-01-09 ENCOUNTER — Ambulatory Visit: Payer: Self-pay | Admitting: Physician Assistant
